# Patient Record
Sex: Female | Born: 1955 | Race: White | Hispanic: No | Marital: Married | Smoking: Never smoker
Health system: Southern US, Community
[De-identification: ages and names within clinical notes are randomized; demographics above are authoritative.]

## PROBLEM LIST (undated history)

## (undated) DIAGNOSIS — E785 Hyperlipidemia, unspecified: Secondary | ICD-10-CM

## (undated) DIAGNOSIS — K219 Gastro-esophageal reflux disease without esophagitis: Secondary | ICD-10-CM

## (undated) DIAGNOSIS — M1990 Unspecified osteoarthritis, unspecified site: Secondary | ICD-10-CM

## (undated) DIAGNOSIS — B019 Varicella without complication: Secondary | ICD-10-CM

## (undated) DIAGNOSIS — J301 Allergic rhinitis due to pollen: Secondary | ICD-10-CM

## (undated) DIAGNOSIS — G43909 Migraine, unspecified, not intractable, without status migrainosus: Secondary | ICD-10-CM

## (undated) DIAGNOSIS — I10 Essential (primary) hypertension: Secondary | ICD-10-CM

## (undated) HISTORY — DX: Unspecified osteoarthritis, unspecified site: M19.90

## (undated) HISTORY — DX: Migraine, unspecified, not intractable, without status migrainosus: G43.909

## (undated) HISTORY — DX: Hyperlipidemia, unspecified: E78.5

## (undated) HISTORY — DX: Essential (primary) hypertension: I10

## (undated) HISTORY — DX: Allergic rhinitis due to pollen: J30.1

## (undated) HISTORY — DX: Varicella without complication: B01.9

## (undated) HISTORY — DX: Gastro-esophageal reflux disease without esophagitis: K21.9

---
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
## 2004-11-03 LAB — HM COLONOSCOPY: HM COLON: NORMAL

## 2008-02-22 ENCOUNTER — Ambulatory Visit: Payer: Self-pay

## 2011-02-25 ENCOUNTER — Ambulatory Visit: Payer: Self-pay | Admitting: Unknown Physician Specialty

## 2012-03-15 LAB — BASIC METABOLIC PANEL: Glucose: 93 mg/dL

## 2013-02-01 LAB — HM MAMMOGRAPHY: HM Mammogram: NORMAL

## 2013-02-01 LAB — HM PAP SMEAR: HM PAP: NORMAL

## 2014-03-15 LAB — HEPATIC FUNCTION PANEL
ALT: 26 U/L (ref 7–35)
AST: 27 U/L (ref 13–35)
Bilirubin, Total: 0.4 mg/dL

## 2014-03-15 LAB — BASIC METABOLIC PANEL
BUN: 13 mg/dL (ref 4–21)
Potassium: 4.1 mmol/L (ref 3.4–5.3)
SODIUM: 141 mmol/L (ref 137–147)

## 2014-03-15 LAB — LIPID PANEL
CHOLESTEROL: 198 mg/dL (ref 0–200)
HDL: 53 mg/dL (ref 35–70)
Triglycerides: 143 mg/dL (ref 40–160)

## 2014-08-27 ENCOUNTER — Emergency Department: Payer: Self-pay | Admitting: Emergency Medicine

## 2015-01-23 ENCOUNTER — Ambulatory Visit: Payer: Self-pay | Admitting: Nurse Practitioner

## 2015-01-24 ENCOUNTER — Ambulatory Visit: Payer: Self-pay | Admitting: Nurse Practitioner

## 2015-02-12 ENCOUNTER — Ambulatory Visit (INDEPENDENT_AMBULATORY_CARE_PROVIDER_SITE_OTHER): Payer: Managed Care, Other (non HMO) | Admitting: Nurse Practitioner

## 2015-02-12 ENCOUNTER — Encounter: Payer: Self-pay | Admitting: Nurse Practitioner

## 2015-02-12 ENCOUNTER — Encounter (INDEPENDENT_AMBULATORY_CARE_PROVIDER_SITE_OTHER): Payer: Self-pay

## 2015-02-12 VITALS — BP 148/78 | HR 60 | Temp 98.8°F | Resp 14 | Ht 59.0 in | Wt 165.0 lb

## 2015-02-12 DIAGNOSIS — K219 Gastro-esophageal reflux disease without esophagitis: Secondary | ICD-10-CM

## 2015-02-12 DIAGNOSIS — Z13220 Encounter for screening for lipoid disorders: Secondary | ICD-10-CM | POA: Diagnosis not present

## 2015-02-12 DIAGNOSIS — R208 Other disturbances of skin sensation: Secondary | ICD-10-CM

## 2015-02-12 DIAGNOSIS — Z131 Encounter for screening for diabetes mellitus: Secondary | ICD-10-CM | POA: Diagnosis not present

## 2015-02-12 DIAGNOSIS — Z1329 Encounter for screening for other suspected endocrine disorder: Secondary | ICD-10-CM

## 2015-02-12 DIAGNOSIS — Z23 Encounter for immunization: Secondary | ICD-10-CM

## 2015-02-12 DIAGNOSIS — H04202 Unspecified epiphora, left lacrimal gland: Secondary | ICD-10-CM

## 2015-02-12 DIAGNOSIS — Z7189 Other specified counseling: Secondary | ICD-10-CM | POA: Diagnosis not present

## 2015-02-12 DIAGNOSIS — Z130 Encounter for screening for diseases of the blood and blood-forming organs and certain disorders involving the immune mechanism: Secondary | ICD-10-CM | POA: Diagnosis not present

## 2015-02-12 DIAGNOSIS — Z7689 Persons encountering health services in other specified circumstances: Secondary | ICD-10-CM

## 2015-02-12 DIAGNOSIS — R200 Anesthesia of skin: Secondary | ICD-10-CM

## 2015-02-12 DIAGNOSIS — I10 Essential (primary) hypertension: Secondary | ICD-10-CM | POA: Diagnosis not present

## 2015-02-12 DIAGNOSIS — M1990 Unspecified osteoarthritis, unspecified site: Secondary | ICD-10-CM

## 2015-02-12 LAB — HEMOGLOBIN A1C: Hgb A1c MFr Bld: 5.9 % (ref 4.6–6.5)

## 2015-02-12 LAB — CBC WITH DIFFERENTIAL/PLATELET
Basophils Absolute: 0 10*3/uL (ref 0.0–0.1)
Basophils Relative: 0.6 % (ref 0.0–3.0)
EOS PCT: 0.9 % (ref 0.0–5.0)
Eosinophils Absolute: 0.1 10*3/uL (ref 0.0–0.7)
HCT: 37.9 % (ref 36.0–46.0)
Hemoglobin: 13 g/dL (ref 12.0–15.0)
LYMPHS PCT: 34.9 % (ref 12.0–46.0)
Lymphs Abs: 2.1 10*3/uL (ref 0.7–4.0)
MCHC: 34.3 g/dL (ref 30.0–36.0)
MCV: 83.2 fl (ref 78.0–100.0)
Monocytes Absolute: 0.4 10*3/uL (ref 0.1–1.0)
Monocytes Relative: 6.8 % (ref 3.0–12.0)
Neutro Abs: 3.4 10*3/uL (ref 1.4–7.7)
Neutrophils Relative %: 56.8 % (ref 43.0–77.0)
Platelets: 224 10*3/uL (ref 150.0–400.0)
RBC: 4.56 Mil/uL (ref 3.87–5.11)
RDW: 13.1 % (ref 11.5–15.5)
WBC: 6 10*3/uL (ref 4.0–10.5)

## 2015-02-12 LAB — COMPREHENSIVE METABOLIC PANEL
ALK PHOS: 101 U/L (ref 39–117)
ALT: 27 U/L (ref 0–35)
AST: 26 U/L (ref 0–37)
Albumin: 4.1 g/dL (ref 3.5–5.2)
BILIRUBIN TOTAL: 0.5 mg/dL (ref 0.2–1.2)
BUN: 16 mg/dL (ref 6–23)
CO2: 30 meq/L (ref 19–32)
Calcium: 9.4 mg/dL (ref 8.4–10.5)
Chloride: 102 mEq/L (ref 96–112)
Creatinine, Ser: 0.69 mg/dL (ref 0.40–1.20)
GFR: 93.18 mL/min (ref >60.00)
Glucose, Bld: 89 mg/dL (ref 70–99)
Potassium: 3.9 mEq/L (ref 3.5–5.1)
SODIUM: 137 meq/L (ref 135–145)
TOTAL PROTEIN: 7.6 g/dL (ref 6.0–8.3)

## 2015-02-12 LAB — LIPID PANEL
CHOL/HDL RATIO: 4
Cholesterol: 206 mg/dL — ABNORMAL HIGH (ref 0–200)
HDL: 58.5 mg/dL (ref >39.00)
LDL CALC: 121 mg/dL — AB (ref 0–99)
NonHDL: 147.5
Triglycerides: 134 mg/dL (ref 0.0–149.0)
VLDL: 26.8 mg/dL (ref 0.0–40.0)

## 2015-02-12 LAB — TSH: TSH: 0.45 u[IU]/mL (ref 0.35–4.50)

## 2015-02-12 LAB — VITAMIN B12: VITAMIN B 12: 420 pg/mL (ref 211–911)

## 2015-02-12 NOTE — Progress Notes (Signed)
Pre visit review using our clinic review tool, if applicable. No additional management support is needed unless otherwise documented below in the visit note. 

## 2015-02-12 NOTE — Patient Instructions (Addendum)
Nice to meet you today.   Thank you for getting your tdap- move arm often and take naprosyn as needed for tenderness.   Please visit the lab before leaving.  Follow up in 3 months.   Welcome to [facility]!

## 2015-02-12 NOTE — Assessment & Plan Note (Signed)
Discussed acute and chronic issues. Reviewed health maintenance measures, PFSHx, and immunizations. Obtain routine labs TSH, Lipid panel, CBC w/ diff, A1c, and CMET.

## 2015-02-12 NOTE — Progress Notes (Signed)
Subjective:    Patient ID: Roberta Gill, female    DOB: [birth year] Gill, 59 y.o.   MRN: [id number]  HPI  [name] is a 59 yo female establishing care and a CC of left eye watering x 3 months.   1) New Pt info-  Immunizations- Needs tdap, brand new grand baby   Mammogram- 2014, normal   Pap- 2014 normal   Colonoscopy- 10 years ago wanted a baseline  Eye Exam- 3 weeks ago   Dental Exam- Not UTD, been Roberta Gill years   Roberta Gill) Chronic Problems-  Arthritis- hands bilaterally, not as painful now, ergonomic keyboard and mouse, beaded mat under. Naprosyn helpful when needed.   GERD- intermittent, fried foods trigger, takes [facility] brand acid reliever.   Hyperlipidemia- up in the past, cutting down fatty fried foods   Hypertension- Was on medication in the past, 5 mg lisinopril- stopped in Jan. 2016 because hair was falling out, HCTZ- hair falling out was worse.    Left knee surgery in high school- ligament injury from a fall. Arthroscopic surgery   3) Acute Problems-  Left eye tearing- eye exam 3 weeks ago, also feel tired   Hair thinning- tested thyroid in past, no recent checks   Other Providers-  Ortho hand doctor- 6 years ago, has braces for hands   Review of Systems  Constitutional: Positive for fatigue. Negative for fever, chills and diaphoresis.  HENT: Negative for tinnitus and trouble swallowing.   Eyes: Positive for discharge and visual disturbance.       Left eye watering and blurry- seen by optometrist  Respiratory: Negative for chest tightness, shortness of breath and wheezing.   Cardiovascular: Negative for chest pain, palpitations and leg swelling.  Gastrointestinal: Negative for nausea, vomiting, diarrhea and constipation.  Genitourinary: Negative for difficulty urinating.  Musculoskeletal: Negative for back pain and neck pain.  Skin: Negative for rash.  Neurological: Positive for numbness. Negative for dizziness, weakness and headaches.       Numbness in hands when driving-  both hands all over no particular finger. Hx of carpal tunnel  Hematological: Does not bruise/bleed easily.  Psychiatric/Behavioral: Negative for suicidal ideas and sleep disturbance. The patient is not nervous/anxious.    Past Medical History  Diagnosis Date  . Arthritis   . GERD (gastroesophageal reflux disease)   . Chicken pox   . Hypertension   . Hyperlipidemia   . Hayfever   . Migraine     History   Social History  . Marital Status: Married    Spouse Name: Roberta Gill  . Number of Children: Roberta Gill  . Years of Education: Roberta Gill   Occupational History  . Not on file.   Social History Main Topics  . Smoking status: Never Smoker   . Smokeless tobacco: Never Used  . Alcohol Use: No  . Drug Use: No  . Sexual Activity:    Partners: Male     Comment: Husband   Other Topics Concern  . Not on file   Social History Narrative   [employer] administrator    Lives with husband    Roberta Gill children Roberta Gill, Roberta Gill yo    Roberta Gill grandchildren- Roberta Gill yo and Roberta Gill week yo   No pets in house   Right handed    Caffeine- 1 cup of tea in am, 1 cup tea if lunch out        Water the rest of the day    Enjoys reading and hanging with the family  Past Surgical History  Procedure Laterality Date  . Cesarean section    . Tonsillectomy    . Knee surgery Left 1976    Family History  Problem Relation Age of Onset  . Arthritis Mother   . Cancer Mother     breast  . Heart disease Mother   . Hypertension Mother   . Kidney disease Mother   . Diabetes Mother   . Heart disease Father   . Hypertension Paternal Grandfather     Not on File  No current outpatient prescriptions on file prior to visit.   No current facility-administered medications on file prior to visit.      Objective:   Physical Exam  Constitutional: She is oriented to person, place, and time. She appears well-developed and well-nourished. No distress.  BP 148/78 mmHg  Pulse 60  Temp(Src) 98.8 F (37.1 C) (Oral)  Resp 14  Ht 4'  11" (1.499 m)  Wt 165 lb (74.844 kg)  BMI 33.31 kg/m2  SpO2 98%  LMP 02/12/2008   HENT:  Head: Normocephalic and atraumatic.  Right Ear: External ear normal.  Left Ear: External ear normal.  Eyes: Conjunctivae and EOM are normal. Pupils are equal, round, and reactive to light. Right eye exhibits no discharge. Left eye exhibits discharge. No scleral icterus.  Watery discharge- no visible findings, probable blocked duct  Neck: Normal range of motion. Neck supple. No thyromegaly present.  Cardiovascular: Normal rate, regular rhythm, normal heart sounds and intact distal pulses.  Exam reveals no gallop and no friction rub.   No murmur heard. Pulmonary/Chest: Effort normal and breath sounds normal. No respiratory distress. She has no wheezes. She has no rales. She exhibits no tenderness.  Musculoskeletal: Normal range of motion. She exhibits no edema or tenderness.  Lymphadenopathy:    She has no cervical adenopathy.  Neurological: She is alert and oriented to person, place, and time. No cranial nerve deficit. She exhibits normal muscle tone. Coordination normal.  Skin: Skin is warm and dry. No rash noted. She is not diaphoretic.  Psychiatric: She has a normal mood and affect. Her behavior is normal. Judgment and thought content normal.      Assessment & Plan:

## 2015-02-18 DIAGNOSIS — H04202 Unspecified epiphora, left lacrimal gland: Secondary | ICD-10-CM | POA: Insufficient documentation

## 2015-02-18 DIAGNOSIS — M1990 Unspecified osteoarthritis, unspecified site: Secondary | ICD-10-CM | POA: Insufficient documentation

## 2015-02-18 DIAGNOSIS — I10 Essential (primary) hypertension: Secondary | ICD-10-CM | POA: Insufficient documentation

## 2015-02-18 DIAGNOSIS — R200 Anesthesia of skin: Secondary | ICD-10-CM | POA: Insufficient documentation

## 2015-02-18 DIAGNOSIS — K219 Gastro-esophageal reflux disease without esophagitis: Secondary | ICD-10-CM | POA: Insufficient documentation

## 2015-02-18 NOTE — Assessment & Plan Note (Signed)
Stable with Naprosyn.

## 2015-02-18 NOTE — Assessment & Plan Note (Signed)
Stable with cutting down on trigger foods and [facility] brand acid reliever.

## 2015-02-18 NOTE — Assessment & Plan Note (Signed)
Will obtain B12 level.

## 2015-02-18 NOTE — Assessment & Plan Note (Signed)
BP Readings from Last 3 Encounters:  02/12/15 148/78   Pt reports hair falling out. Wants to wait before starting BP medication.

## 2015-05-15 ENCOUNTER — Ambulatory Visit (INDEPENDENT_AMBULATORY_CARE_PROVIDER_SITE_OTHER): Payer: Managed Care, Other (non HMO) | Admitting: Nurse Practitioner

## 2015-05-15 ENCOUNTER — Encounter: Payer: Self-pay | Admitting: Nurse Practitioner

## 2015-05-15 VITALS — BP 198/98 | HR 52 | Temp 98.3°F | Resp 16 | Ht 59.0 in | Wt 162.2 lb

## 2015-05-15 DIAGNOSIS — R200 Anesthesia of skin: Secondary | ICD-10-CM

## 2015-05-15 DIAGNOSIS — H04202 Unspecified epiphora, left lacrimal gland: Secondary | ICD-10-CM | POA: Diagnosis not present

## 2015-05-15 DIAGNOSIS — R208 Other disturbances of skin sensation: Secondary | ICD-10-CM | POA: Diagnosis not present

## 2015-05-15 DIAGNOSIS — I10 Essential (primary) hypertension: Secondary | ICD-10-CM | POA: Diagnosis not present

## 2015-05-15 MED ORDER — AMLODIPINE BESYLATE 5 MG PO TABS: 5.0000 mg | ORAL_TABLET | Freq: Every day | ORAL | Status: DC

## 2015-05-15 NOTE — Assessment & Plan Note (Signed)
Resolved. No further complaints. She reports she has had nothing done.

## 2015-05-15 NOTE — Assessment & Plan Note (Signed)
Sees ortho- changed her sleeping position and it is improving. Will follow.

## 2015-05-15 NOTE — Progress Notes (Signed)
Pre visit review using our clinic review tool, if applicable. No additional management support is needed unless otherwise documented below in the visit note. 

## 2015-05-15 NOTE — Progress Notes (Signed)
   Subjective:    Patient ID: Roberta Gill, female    DOB: 12-07-1955, 59 y.o.   MRN: [id number]  HPI  Roberta Gill is a 59 yo female here for a 3 month follow up.   1) Watery eyes- took care of itself, stopped after 2 more weeks.   2) Carpal Tunnel- still experiencing numbness when driving, some improvement  3) HTN- can't do lisinopril and HCTZ- makes her hair fall out she reports.   Review of Systems  Constitutional: Negative for fever, chills, diaphoresis and fatigue.  Respiratory: Negative for chest tightness, shortness of breath and wheezing.   Cardiovascular: Negative for chest pain, palpitations and leg swelling.  Gastrointestinal: Negative for nausea, vomiting and diarrhea.  Skin: Negative for rash.  Neurological: Positive for numbness. Negative for dizziness, weakness and headaches.       Pt has carpal tunnel- improving and seeing ortho  Psychiatric/Behavioral: The patient is not nervous/anxious.       Objective:   Physical Exam  Constitutional: She is oriented to person, place, and time. She appears well-developed and well-nourished. No distress.  BP 198/98 mmHg  Pulse 52  Temp(Src) 98.3 F (36.8 C)  Resp 16  Ht 4\' 11"  (1.499 m)  Wt 162 lb 3.2 oz (73.573 kg)  BMI 32.74 kg/m2  SpO2 98%  LMP 02/12/2008   HENT:  Head: Normocephalic and atraumatic.  Right Ear: External ear normal.  Left Ear: External ear normal.  Cardiovascular: Normal rate, regular rhythm, normal heart sounds and intact distal pulses.  Exam reveals no gallop and no friction rub.   No murmur heard. Pulmonary/Chest: Effort normal and breath sounds normal. No respiratory distress. She has no wheezes. She has no rales. She exhibits no tenderness.  Musculoskeletal: She exhibits edema.  Ankles slightly non-pitting edema  Neurological: She is alert and oriented to person, place, and time. No cranial nerve deficit. She exhibits normal muscle tone. Coordination normal.  Skin: Skin is warm and dry. No  rash noted. She is not diaphoretic.  Psychiatric: She has a normal mood and affect. Her behavior is normal. Judgment and thought content normal.      Assessment & Plan:

## 2015-05-15 NOTE — Patient Instructions (Signed)
Follow up in 2 months for BP.   Call us next week with a reading and pulse please.

## 2015-05-15 NOTE — Assessment & Plan Note (Signed)
Pt reports Lisinopril and HCTZ makes her hair fall out- notices after a few weeks to 1 month after taking the medication. Will try amlodipine 5 mg daily. Asked her to call us with a BP and pulse in 1 week (stops by [facility] on her way home to check). Will follow up in 2 months- need lab work.

## 2015-07-15 ENCOUNTER — Other Ambulatory Visit: Payer: Self-pay | Admitting: Nurse Practitioner

## 2015-07-17 ENCOUNTER — Ambulatory Visit: Payer: Managed Care, Other (non HMO) | Admitting: Nurse Practitioner

## 2015-07-20 ENCOUNTER — Ambulatory Visit (INDEPENDENT_AMBULATORY_CARE_PROVIDER_SITE_OTHER): Payer: Managed Care, Other (non HMO) | Admitting: Nurse Practitioner

## 2015-07-20 ENCOUNTER — Encounter: Payer: Self-pay | Admitting: Nurse Practitioner

## 2015-07-20 VITALS — BP 124/66 | HR 54 | Temp 98.1°F | Resp 16 | Ht 59.0 in | Wt 163.8 lb

## 2015-07-20 DIAGNOSIS — R208 Other disturbances of skin sensation: Secondary | ICD-10-CM | POA: Diagnosis not present

## 2015-07-20 DIAGNOSIS — I10 Essential (primary) hypertension: Secondary | ICD-10-CM

## 2015-07-20 DIAGNOSIS — H04202 Unspecified epiphora, left lacrimal gland: Secondary | ICD-10-CM

## 2015-07-20 DIAGNOSIS — R200 Anesthesia of skin: Secondary | ICD-10-CM

## 2015-07-20 LAB — BASIC METABOLIC PANEL
BUN: 16 mg/dL (ref 6–23)
CHLORIDE: 102 meq/L (ref 96–112)
CO2: 29 meq/L (ref 19–32)
Calcium: 9.4 mg/dL (ref 8.4–10.5)
Creatinine, Ser: 0.74 mg/dL (ref 0.40–1.20)
GFR: 85.82 mL/min (ref >60.00)
Glucose, Bld: 79 mg/dL (ref 70–99)
POTASSIUM: 3.8 meq/L (ref 3.5–5.1)
SODIUM: 139 meq/L (ref 135–145)

## 2015-07-20 NOTE — Patient Instructions (Signed)
Awesome blood pressure! Follow up in 6 months.   Please stop by lab before leaving today.

## 2015-07-20 NOTE — Progress Notes (Signed)
Pre visit review using our clinic review tool, if applicable. No additional management support is needed unless otherwise documented below in the visit note. 

## 2015-07-20 NOTE — Assessment & Plan Note (Signed)
BP Readings from Last 3 Encounters:  07/20/15 124/66  05/15/15 198/98  02/12/15 148/78   Lab Results  Component Value Date   CREATININE 0.69 02/12/2015   Will obtain BMET today. FU in 6 months. Continue Amlodipine 5 mg

## 2015-07-20 NOTE — Assessment & Plan Note (Addendum)
Stable, no further complaints.

## 2015-07-20 NOTE — Progress Notes (Signed)
Patient ID: Roberta Gill, female    DOB: Apr 03, 1956  Age: 59 y.o. MRN: [id number]  CC: Follow-up   HPI [name] presents for follow up of numbness in hands, watery left eye, and HTN.   1) Watery left eye- Resolved    2) Carpal Tunnel- Same, no further complaints  3) HTN- Started amlodipine 5 mg at last visit and has had no further complaints. Pt reports no change in diet and exercise from last visit.   History Roberta Gill has a past medical history of Arthritis; GERD (gastroesophageal reflux disease); Chicken pox; Hypertension; Hyperlipidemia; Hayfever; and Migraine.   She has past surgical history that includes Cesarean section; Tonsillectomy; and Knee surgery (Left, 1976).   Her family history includes Arthritis in her mother; Cancer in her mother; Diabetes in her mother; Heart disease in her father and mother; Hypertension in her mother and paternal grandfather; Kidney disease in her mother.She reports that she has never smoked. She has never used smokeless tobacco. She reports that she does not drink alcohol or use illicit drugs.  Outpatient Prescriptions Prior to Visit  Medication Sig Dispense Refill  . amLODipine (NORVASC) 5 MG tablet TAKE 1 TABLET (5 MG TOTAL) BY MOUTH DAILY. 90 tablet 0  . cholecalciferol (VITAMIN D) 1000 UNITS tablet Take 2,000 Units by mouth daily.    . Multiple Vitamin (MULTIVITAMIN) tablet Take 1 tablet by mouth daily.     No facility-administered medications prior to visit.    ROS Review of Systems  Constitutional: Negative for fever, chills, diaphoresis and fatigue.  Eyes: Negative for visual disturbance.  Respiratory: Negative for cough.   Cardiovascular: Negative for chest pain, palpitations and leg swelling.  Gastrointestinal: Negative for nausea, vomiting and diarrhea.  Skin: Negative for rash.    Objective:  BP 124/66 mmHg  Pulse 54  Temp(Src) 98.1 F (36.7 C)  Resp 16  Ht 4\' 11"  (1.499 m)  Wt 163 lb 12.8 oz (74.299 kg)  BMI  33.07 kg/m2  SpO2 98%  LMP 02/12/2008  Physical Exam  Constitutional: She is oriented to person, place, and time. She appears well-developed and well-nourished. No distress.  HENT:  Head: Normocephalic and atraumatic.  Right Ear: External ear normal.  Left Ear: External ear normal.  Eyes: Right eye exhibits no discharge. Left eye exhibits no discharge. No scleral icterus.  Cardiovascular: Normal rate and regular rhythm.   Pulmonary/Chest: Effort normal and breath sounds normal. No respiratory distress. She has no wheezes. She has no rales. She exhibits no tenderness.  Musculoskeletal: She exhibits no edema.  No LE edema  Neurological: She is alert and oriented to person, place, and time. No cranial nerve deficit. She exhibits normal muscle tone. Coordination normal.  Skin: Skin is warm and dry. No rash noted. She is not diaphoretic.  Psychiatric: She has a normal mood and affect. Her behavior is normal. Judgment and thought content normal.   Assessment & Plan:   Merit was seen today for follow-up.  Diagnoses and all orders for this visit:  Benign essential HTN -     Basic Metabolic Panel (BMET)  I am having Ms. Landgren maintain her multivitamin, cholecalciferol, amLODipine, and Loratadine.  Meds ordered this encounter  Medications  . Loratadine (CLARITIN) 10 MG CAPS    Sig: Take by mouth as needed.     Follow-up: Return in about 6 months (around 01/17/2016) for HTN.

## 2015-07-20 NOTE — Assessment & Plan Note (Signed)
Resolved

## 2015-08-18 ENCOUNTER — Other Ambulatory Visit: Payer: Self-pay | Admitting: Nurse Practitioner

## 2015-10-05 ENCOUNTER — Telehealth: Payer: Self-pay | Admitting: *Deleted

## 2015-10-05 NOTE — Telephone Encounter (Signed)
Patient has concern's if her lab results were faxed to [facility]. Patient stated that she will fax over results herself also.

## 2015-10-08 NOTE — Telephone Encounter (Signed)
Roberta Gill please advise?

## 2016-04-18 ENCOUNTER — Other Ambulatory Visit: Payer: Self-pay | Admitting: Obstetrics & Gynecology

## 2016-04-18 DIAGNOSIS — Z1239 Encounter for other screening for malignant neoplasm of breast: Secondary | ICD-10-CM

## 2016-05-14 ENCOUNTER — Ambulatory Visit
Admission: RE | Admit: 2016-05-14 | Discharge: 2016-05-14 | Disposition: A | Discharge status: Home / Self Care | Payer: Managed Care, Other (non HMO) | Source: Ambulatory Visit | Attending: Obstetrics & Gynecology | Admitting: Obstetrics & Gynecology

## 2016-05-14 ENCOUNTER — Other Ambulatory Visit: Payer: Self-pay | Admitting: Obstetrics & Gynecology

## 2016-05-14 DIAGNOSIS — R921 Mammographic calcification found on diagnostic imaging of breast: Secondary | ICD-10-CM | POA: Insufficient documentation

## 2016-05-14 DIAGNOSIS — Z1231 Encounter for screening mammogram for malignant neoplasm of breast: Secondary | ICD-10-CM | POA: Diagnosis present

## 2016-05-14 DIAGNOSIS — Z1239 Encounter for other screening for malignant neoplasm of breast: Secondary | ICD-10-CM

## 2016-05-20 ENCOUNTER — Other Ambulatory Visit: Payer: Self-pay | Admitting: Obstetrics & Gynecology

## 2016-05-20 DIAGNOSIS — R921 Mammographic calcification found on diagnostic imaging of breast: Secondary | ICD-10-CM

## 2016-06-12 ENCOUNTER — Ambulatory Visit
Admission: RE | Admit: 2016-06-12 | Discharge: 2016-06-12 | Disposition: A | Discharge status: Home / Self Care | Payer: Managed Care, Other (non HMO) | Source: Ambulatory Visit | Attending: Obstetrics & Gynecology | Admitting: Obstetrics & Gynecology

## 2016-06-12 DIAGNOSIS — R921 Mammographic calcification found on diagnostic imaging of breast: Secondary | ICD-10-CM | POA: Insufficient documentation

## 2016-06-20 ENCOUNTER — Other Ambulatory Visit: Payer: Self-pay | Admitting: Obstetrics & Gynecology

## 2016-06-20 DIAGNOSIS — R921 Mammographic calcification found on diagnostic imaging of breast: Secondary | ICD-10-CM

## 2016-06-25 ENCOUNTER — Ambulatory Visit
Admission: RE | Admit: 2016-06-25 | Discharge: 2016-06-25 | Disposition: A | Discharge status: Home / Self Care | Payer: Managed Care, Other (non HMO) | Source: Ambulatory Visit | Attending: Obstetrics & Gynecology | Admitting: Obstetrics & Gynecology

## 2016-06-25 DIAGNOSIS — R921 Mammographic calcification found on diagnostic imaging of breast: Secondary | ICD-10-CM | POA: Insufficient documentation

## 2016-06-25 DIAGNOSIS — D241 Benign neoplasm of right breast: Secondary | ICD-10-CM | POA: Insufficient documentation

## 2016-06-25 DIAGNOSIS — N6021 Fibroadenosis of right breast: Secondary | ICD-10-CM | POA: Insufficient documentation

## 2016-06-25 HISTORY — PX: BREAST BIOPSY: SHX20

## 2016-06-26 LAB — SURGICAL PATHOLOGY

## 2016-08-14 ENCOUNTER — Encounter: Payer: Self-pay | Admitting: *Deleted

## 2016-08-18 ENCOUNTER — Ambulatory Visit: Payer: Managed Care, Other (non HMO) | Admitting: Anesthesiology

## 2016-08-18 ENCOUNTER — Ambulatory Visit
Admission: RE | Admit: 2016-08-18 | Discharge: 2016-08-18 | Disposition: A | Discharge status: Home / Self Care | Payer: Managed Care, Other (non HMO) | Source: Ambulatory Visit | Attending: Unknown Physician Specialty | Admitting: Unknown Physician Specialty

## 2016-08-18 ENCOUNTER — Encounter
Admission: RE | Disposition: A | Discharge status: Home / Self Care | Payer: Self-pay | Source: Ambulatory Visit | Attending: Unknown Physician Specialty

## 2016-08-18 ENCOUNTER — Encounter: Payer: Self-pay | Admitting: *Deleted

## 2016-08-18 DIAGNOSIS — I10 Essential (primary) hypertension: Secondary | ICD-10-CM | POA: Insufficient documentation

## 2016-08-18 DIAGNOSIS — Z1211 Encounter for screening for malignant neoplasm of colon: Secondary | ICD-10-CM | POA: Insufficient documentation

## 2016-08-18 DIAGNOSIS — G43909 Migraine, unspecified, not intractable, without status migrainosus: Secondary | ICD-10-CM | POA: Insufficient documentation

## 2016-08-18 DIAGNOSIS — Z79899 Other long term (current) drug therapy: Secondary | ICD-10-CM | POA: Diagnosis not present

## 2016-08-18 DIAGNOSIS — K640 First degree hemorrhoids: Secondary | ICD-10-CM | POA: Insufficient documentation

## 2016-08-18 DIAGNOSIS — E785 Hyperlipidemia, unspecified: Secondary | ICD-10-CM | POA: Diagnosis not present

## 2016-08-18 DIAGNOSIS — K5730 Diverticulosis of large intestine without perforation or abscess without bleeding: Secondary | ICD-10-CM | POA: Diagnosis not present

## 2016-08-18 DIAGNOSIS — M1990 Unspecified osteoarthritis, unspecified site: Secondary | ICD-10-CM | POA: Diagnosis not present

## 2016-08-18 DIAGNOSIS — K219 Gastro-esophageal reflux disease without esophagitis: Secondary | ICD-10-CM | POA: Diagnosis not present

## 2016-08-18 HISTORY — PX: COLONOSCOPY WITH PROPOFOL: SHX5780

## 2016-08-18 SURGERY — COLONOSCOPY WITH PROPOFOL
Anesthesia: General

## 2016-08-18 MED ORDER — PROPOFOL 10 MG/ML IV BOLUS
INTRAVENOUS | Status: DC | PRN
  Administered 2016-08-18: 90 mg via INTRAVENOUS

## 2016-08-18 MED ORDER — SODIUM CHLORIDE 0.9 % IV SOLN
INTRAVENOUS | Status: DC
  Administered 2016-08-18: 14:00:00 via INTRAVENOUS

## 2016-08-18 MED ORDER — PROPOFOL 500 MG/50ML IV EMUL
INTRAVENOUS | Status: DC | PRN
  Administered 2016-08-18: 140 ug/kg/min via INTRAVENOUS

## 2016-08-18 MED ORDER — SODIUM CHLORIDE 0.9 % IV SOLN: INTRAVENOUS | Status: DC

## 2016-08-18 MED ORDER — MIDAZOLAM HCL 2 MG/2ML IJ SOLN
INTRAMUSCULAR | Status: DC | PRN
  Administered 2016-08-18: 1 mg via INTRAVENOUS

## 2016-08-18 NOTE — H&P (Signed)
Primary Care Physician:  Hoyt Koch, MD Primary Gastroenterologist:  Dr. Vira Agar  Pre-Procedure History & Physical: HPI:  Roberta Gill is a 60 y.o. female is here for an colonoscopy.   Past Medical History:  Diagnosis Date  . Arthritis   . Chicken pox   . GERD (gastroesophageal reflux disease)   . Hayfever   . Hyperlipidemia   . Hypertension   . Migraine     Past Surgical History:  Procedure Laterality Date  . BREAST BIOPSY Right 06/25/2016   path pending  . CESAREAN SECTION    . KNEE SURGERY Left 1976    Prior to Admission medications   Medication Sig Start Date End Date Taking? Authorizing Provider  amLODipine (NORVASC) 5 MG tablet TAKE 1 TABLET (5 MG TOTAL) BY MOUTH DAILY. 08/18/15  Yes Rubbie Battiest, NP  cholecalciferol (VITAMIN D) 1000 UNITS tablet Take 2,000 Units by mouth daily.   Yes Historical Provider, MD  Multiple Vitamin (MULTIVITAMIN) tablet Take 1 tablet by mouth daily.   Yes Historical Provider, MD  Loratadine (CLARITIN) 10 MG CAPS Take by mouth as needed.    Historical Provider, MD    Allergies as of 06/26/2016 - Review Complete 07/20/2015  Allergen Reaction Noted  . Amoxicillin-pot clavulanate  02/12/2015    Family History  Problem Relation Age of Onset  . Arthritis Mother   . Cancer Mother     breast  . Heart disease Mother   . Hypertension Mother   . Kidney disease Mother   . Diabetes Mother   . Breast cancer Mother   . Hypertension Paternal Grandfather   . Heart disease Father     Social History   Social History  . Marital status: Married    Spouse name: N/A  . Number of children: N/A  . Years of education: N/A   Occupational History  . Not on file.   Social History Main Topics  . Smoking status: Never Smoker  . Smokeless tobacco: Never Used  . Alcohol use No  . Drug use: No  . Sexual activity: Yes    Partners: Male     Comment: Husband   Other Topics Concern  . 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 Neck:  Supple; no masses or thyromegaly. Lungs:  Clear throughout to auscultation.    Heart:  Regular rate and rhythm. Abdomen:  Soft, nontender and nondistended. Normal bowel sounds, without guarding, and without rebound.   Neurologic:  Alert and  oriented x4;  grossly normal neurologically.  Impression/Plan: KIMI MOCHIZUKI is here for an colonoscopy  to be performed for screening colonoscopy  Risks, benefits, limitations, and alternatives regarding  colonoscopy have been reviewed with the patient.  Questions have been answered.  All parties agreeable.   Gaylyn Cheers, MD  08/18/2016, 2:15 PM

## 2016-08-18 NOTE — Anesthesia Preprocedure Evaluation (Signed)
Anesthesia Evaluation  Patient identified by MRN, date of birth, ID band Patient awake    Reviewed: Allergy & Precautions, H&P , NPO status , Patient's Chart, lab work & pertinent test results  History of Anesthesia Complications Negative for: history of anesthetic complications  Airway Mallampati: III  TM Distance: >3 FB Neck ROM: full    Dental  (+) Poor Dentition   Pulmonary neg pulmonary ROS, neg shortness of breath,    Pulmonary exam normal breath sounds clear to auscultation       Cardiovascular Exercise Tolerance: Good hypertension, (-) angina(-) DOE Normal cardiovascular exam Rhythm:regular Rate:Normal     Neuro/Psych  Headaches, negative psych ROS   GI/Hepatic Neg liver ROS, GERD  Controlled,  Endo/Other  negative endocrine ROS  Renal/GU negative Renal ROS  negative genitourinary   Musculoskeletal  (+) Arthritis ,   Abdominal   Peds  Hematology negative hematology ROS (+)   Anesthesia Other Findings Past Medical History: No date: Arthritis No date: Chicken pox No date: GERD (gastroesophageal reflux disease) No date: Hayfever No date: Hyperlipidemia No date: Hypertension No date: Migraine  Past Surgical History: 06/25/2016: BREAST BIOPSY Right     Comment: path pending No date: CESAREAN SECTION 1976: KNEE SURGERY Left  BMI    Body Mass Index:  32.42 kg/m      Reproductive/Obstetrics negative OB ROS                             Anesthesia Physical Anesthesia Plan  ASA: III  Anesthesia Plan: General   Post-op Pain Management:    Induction:   Airway Management Planned:   Additional Equipment:   Intra-op Plan:   Post-operative Plan:   Informed Consent: I have reviewed the patients History and Physical, chart, labs and discussed the procedure including the risks, benefits and alternatives for the proposed anesthesia with the patient or authorized  representative who has indicated his/her understanding and acceptance.   Dental Advisory Given  Plan Discussed with: Anesthesiologist, CRNA and Surgeon  Anesthesia Plan Comments:         Anesthesia Quick Evaluation

## 2016-08-18 NOTE — Transfer of Care (Signed)
Immediate Anesthesia Transfer of Care Note  Patient: Roberta Gill  Procedure(s) Performed: Procedure(s): COLONOSCOPY WITH PROPOFOL (N/A)  Patient Location: PACU and Endoscopy Unit  Anesthesia Type:General  Level of Consciousness: patient cooperative and lethargic  Airway & Oxygen Therapy: Patient Spontanous Breathing and Patient connected to nasal cannula oxygen  Post-op Assessment: Report given to RN and Post -op Vital signs reviewed and stable  Post vital signs: Reviewed and stable  Last Vitals:  Vitals:   08/18/16 1321 08/18/16 1445  BP: 138/75 (!) 88/44  Pulse: 68 (!) 54  Resp: 20 14  Temp: 36.6 C 36.4 C    Last Pain:  Vitals:   08/18/16 1445  TempSrc: Tympanic  PainSc: Asleep         Complications: No apparent anesthesia complications

## 2016-08-18 NOTE — Op Note (Signed)
[facility] Gastroenterology Patient Name: Roberta Gill Procedure Date: 08/18/2016 2:19 PM MRN: [id number] Account #: [id number] Date of Birth: [birth year] Admit Type: Outpatient Age: 60 Room: [facility] ENDO ROOM 4 Gender: Female Note Status: Finalized Procedure:            Colonoscopy Indications:          Screening for colorectal malignant neoplasm Providers:            Manya Silvas, MD Referring MD:         Barnett Applebaum, MD (Referring MD) Medicines:            Propofol per Anesthesia Complications:        No immediate complications. Procedure:            Pre-Anesthesia Assessment:                       - After reviewing the risks and benefits, the patient                        was deemed in satisfactory condition to undergo the                        procedure.                       After obtaining informed consent, the colonoscope was                        passed under direct vision. Throughout the procedure,                        the patient's blood pressure, pulse, and oxygen                        saturations were monitored continuously. The                        Colonoscope was introduced through the anus and                        advanced to the the cecum, identified by appendiceal                        orifice and ileocecal valve. The colonoscopy was                        performed without difficulty. The patient tolerated the                        procedure well. The quality of the bowel preparation                        was excellent. Findings:      A few small-mouthed diverticula were found in the sigmoid colon.      Internal hemorrhoids were found during endoscopy. The hemorrhoids were       small and Grade I (internal hemorrhoids that do not prolapse).      The exam was otherwise without abnormality. Impression:           - Diverticulosis in the sigmoid colon.                       -  Internal hemorrhoids.                       - The  examination was otherwise normal.                       - No specimens collected. Recommendation:       - Repeat colonoscopy in 10 years for screening                        purposes. If a close relative has had colon cancer or                        colon polyps then repeat in 5 years. Manya Silvas, MD 08/18/2016 2:42:14 PM This report has been signed electronically. Number of Addenda: 0 Note Initiated On: 08/18/2016 2:19 PM Scope Withdrawal Time: 0 hours 9 minutes 9 seconds  Total Procedure Duration: 0 hours 13 minutes 13 seconds       [facility]

## 2016-08-19 NOTE — Anesthesia Postprocedure Evaluation (Signed)
Anesthesia Post Note  Patient: Roberta Gill  Procedure(s) Performed: Procedure(s) (LRB): COLONOSCOPY WITH PROPOFOL (N/A)  Patient location during evaluation: Endoscopy Anesthesia Type: General Level of consciousness: awake and alert Pain management: pain level controlled Vital Signs Assessment: post-procedure vital signs reviewed and stable Respiratory status: spontaneous breathing, nonlabored ventilation and respiratory function stable Cardiovascular status: blood pressure returned to baseline and stable Postop Assessment: no signs of nausea or vomiting Anesthetic complications: no    Last Vitals:  Vitals:   08/18/16 1455 08/18/16 1505  BP: (!) 121/53 136/69  Pulse: (!) 55 60  Resp: 18 17  Temp:      Last Pain:  Vitals:   08/18/16 1445  TempSrc: Tympanic  PainSc: Asleep                 Martha Clan

## 2016-08-23 ENCOUNTER — Encounter: Payer: Self-pay | Admitting: Unknown Physician Specialty

## 2016-09-10 ENCOUNTER — Other Ambulatory Visit: Payer: Self-pay | Admitting: Nurse Practitioner

## 2016-12-30 ENCOUNTER — Other Ambulatory Visit: Payer: Self-pay | Admitting: Pediatrics

## 2017-11-12 ENCOUNTER — Other Ambulatory Visit: Payer: Self-pay | Admitting: Obstetrics & Gynecology

## 2017-11-12 DIAGNOSIS — Z87898 Personal history of other specified conditions: Secondary | ICD-10-CM

## 2017-11-12 DIAGNOSIS — R921 Mammographic calcification found on diagnostic imaging of breast: Secondary | ICD-10-CM

## 2017-12-04 ENCOUNTER — Other Ambulatory Visit: Payer: Managed Care, Other (non HMO)

## 2017-12-07 ENCOUNTER — Ambulatory Visit
Admission: RE | Admit: 2017-12-07 | Discharge: 2017-12-07 | Disposition: A | Discharge status: Home / Self Care | Payer: Managed Care, Other (non HMO) | Source: Ambulatory Visit | Attending: Obstetrics & Gynecology | Admitting: Obstetrics & Gynecology

## 2017-12-07 DIAGNOSIS — R921 Mammographic calcification found on diagnostic imaging of breast: Secondary | ICD-10-CM

## 2017-12-07 DIAGNOSIS — Z87898 Personal history of other specified conditions: Secondary | ICD-10-CM

## 2018-02-18 ENCOUNTER — Other Ambulatory Visit: Payer: Self-pay | Admitting: "Endocrinology

## 2018-02-18 DIAGNOSIS — E0590 Thyrotoxicosis, unspecified without thyrotoxic crisis or storm: Secondary | ICD-10-CM

## 2018-03-01 ENCOUNTER — Encounter
Admission: RE | Admit: 2018-03-01 | Discharge: 2018-03-01 | Disposition: A | Discharge status: Home / Self Care | Payer: Managed Care, Other (non HMO) | Source: Ambulatory Visit | Attending: "Endocrinology | Admitting: "Endocrinology

## 2018-03-01 DIAGNOSIS — E0590 Thyrotoxicosis, unspecified without thyrotoxic crisis or storm: Secondary | ICD-10-CM

## 2018-03-01 MED ORDER — SODIUM IODIDE I-123 7.4 MBQ CAPS
151.4330 | ORAL_CAPSULE | Freq: Once | ORAL | Status: AC
  Administered 2018-03-01: 151.433 via ORAL

## 2018-03-02 ENCOUNTER — Encounter
Admission: RE | Admit: 2018-03-02 | Discharge: 2018-03-02 | Disposition: A | Discharge status: Home / Self Care | Payer: Managed Care, Other (non HMO) | Source: Ambulatory Visit | Attending: "Endocrinology | Admitting: "Endocrinology

## 2018-03-12 ENCOUNTER — Encounter
Admission: RE | Admit: 2018-03-12 | Discharge: 2018-03-12 | Disposition: A | Discharge status: Home / Self Care | Payer: Managed Care, Other (non HMO) | Source: Ambulatory Visit | Attending: "Endocrinology | Admitting: "Endocrinology

## 2018-03-12 DIAGNOSIS — E0590 Thyrotoxicosis, unspecified without thyrotoxic crisis or storm: Secondary | ICD-10-CM

## 2018-03-12 MED ORDER — SODIUM IODIDE I 131 CAPSULE
29.6900 | Freq: Once | INTRAVENOUS | Status: AC | PRN
  Administered 2018-03-12: 29.69 via ORAL

## 2018-11-26 ENCOUNTER — Other Ambulatory Visit: Payer: Self-pay | Admitting: Obstetrics & Gynecology

## 2018-11-26 DIAGNOSIS — Z1231 Encounter for screening mammogram for malignant neoplasm of breast: Secondary | ICD-10-CM

## 2018-12-14 ENCOUNTER — Ambulatory Visit
Admission: RE | Admit: 2018-12-14 | Discharge: 2018-12-14 | Disposition: A | Discharge status: Home / Self Care | Payer: 59 | Source: Ambulatory Visit | Attending: Obstetrics & Gynecology | Admitting: Obstetrics & Gynecology

## 2018-12-14 DIAGNOSIS — Z1231 Encounter for screening mammogram for malignant neoplasm of breast: Secondary | ICD-10-CM | POA: Diagnosis present

## 2018-12-21 ENCOUNTER — Other Ambulatory Visit: Payer: Self-pay | Admitting: Student

## 2018-12-21 DIAGNOSIS — R748 Abnormal levels of other serum enzymes: Secondary | ICD-10-CM

## 2018-12-21 DIAGNOSIS — K760 Fatty (change of) liver, not elsewhere classified: Secondary | ICD-10-CM

## 2018-12-29 ENCOUNTER — Ambulatory Visit
Admission: RE | Admit: 2018-12-29 | Discharge: 2018-12-29 | Disposition: A | Discharge status: Home / Self Care | Payer: 59 | Source: Ambulatory Visit | Attending: Student | Admitting: Student

## 2018-12-29 DIAGNOSIS — R748 Abnormal levels of other serum enzymes: Secondary | ICD-10-CM | POA: Insufficient documentation

## 2018-12-29 DIAGNOSIS — K760 Fatty (change of) liver, not elsewhere classified: Secondary | ICD-10-CM

## 2018-12-29 LAB — POCT I-STAT CREATININE: Creatinine, Ser: 0.7 mg/dL (ref 0.44–1.00)

## 2018-12-29 MED ORDER — GADOBUTROL 1 MMOL/ML IV SOLN
7.0000 mL | Freq: Once | INTRAVENOUS | Status: AC | PRN
  Administered 2018-12-29: 7 mL via INTRAVENOUS

## 2019-12-06 ENCOUNTER — Other Ambulatory Visit: Payer: Self-pay | Admitting: Obstetrics & Gynecology

## 2019-12-06 DIAGNOSIS — Z1231 Encounter for screening mammogram for malignant neoplasm of breast: Secondary | ICD-10-CM

## 2020-01-02 ENCOUNTER — Ambulatory Visit
Admission: RE | Admit: 2020-01-02 | Discharge: 2020-01-02 | Disposition: A | Discharge status: Home / Self Care | Payer: 59 | Source: Ambulatory Visit | Attending: Obstetrics & Gynecology | Admitting: Obstetrics & Gynecology

## 2020-01-02 DIAGNOSIS — Z1231 Encounter for screening mammogram for malignant neoplasm of breast: Secondary | ICD-10-CM

## 2020-08-02 IMAGING — MR MR ABDOMEN WO/W CM
16 of 17 series · 44 of 48 positions shown · IV contrast (7 GADAVIST)
Comparison: None.

CLINICAL DATA: Elevated liver function tests and alkaline
phosphatase level.

EXAM:
MRI ABDOMEN WITHOUT AND WITH CONTRAST
TECHNIQUE: Multiplanar multisequence MR imaging of the abdomen was performed
both before and after the administration of intravenous contrast.
CONTRAST:  7 mL Gadavist

[Series 2: cor ssfse / · coronal · 7.0mm · 1.48mm/px · 2 of 33 slices shown]
[im 1/33]
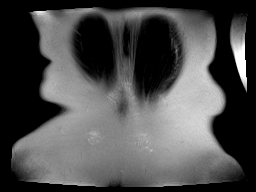
[im 33/33]
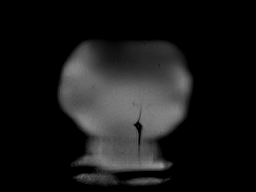

[Series 3: T2 fat-sat · axial · 6.0mm · 1.48mm/px · z∈[-132,+91]mm · 2 of 32 slices shown]
[im 1/32]
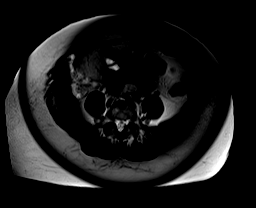
[im 32/32]
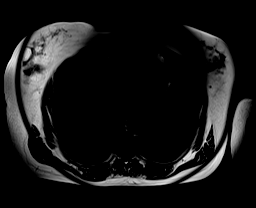

[Series 4: T1 · axial · 6.0mm · 0.74mm/px · z∈[-132,+91]mm · 4 of 64 slices shown]
[im 1/64]
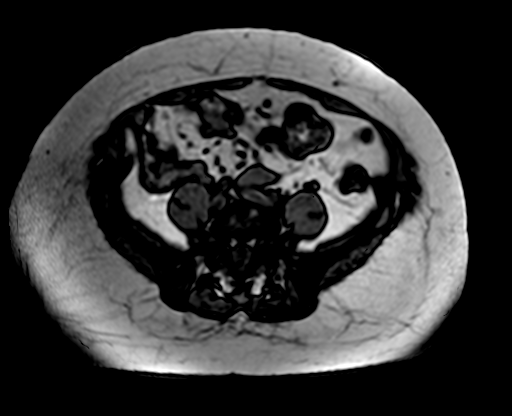
[im 22/64]
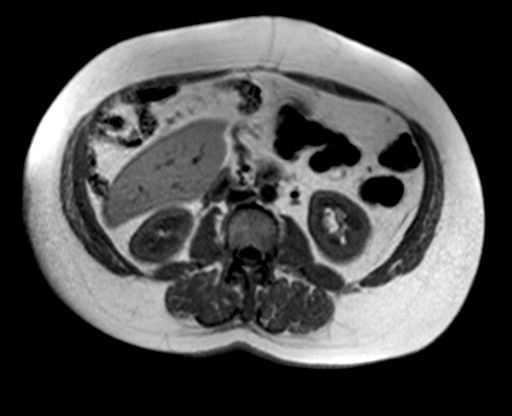
[im 43/64]
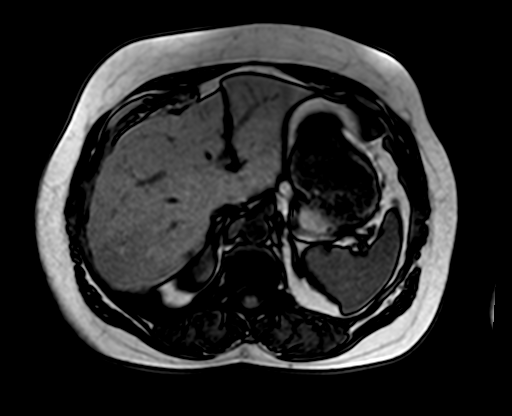
[im 64/64]
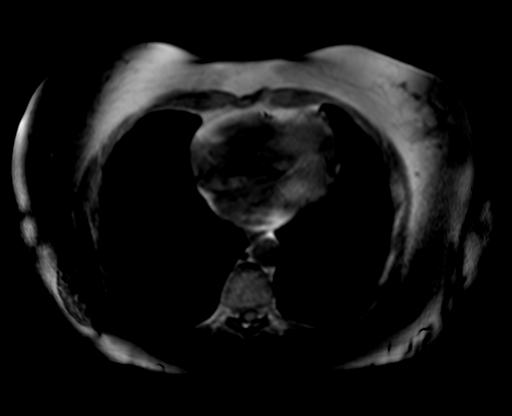

[Series 5: DWI · axial · 6.0mm · 2.00mm/px · z∈[-132,+91]mm · 5 of 96 slices shown (1 of 2)]
[im 1/96]
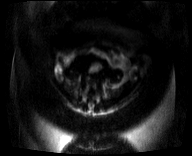
[im 24/96]
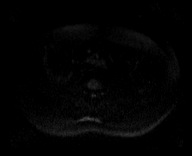
[im 48/96]
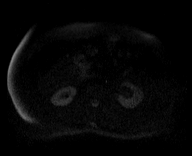
[im 72/96]
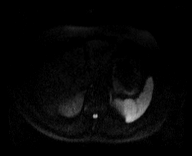
[im 96/96]
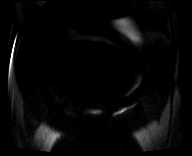

[Series 6: DWI · axial · 6.0mm · 2.00mm/px · z∈[-132,+91]mm · 2 of 32 slices shown (2 of 2)]
[im 1/32]
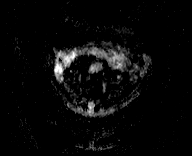
[im 32/32]
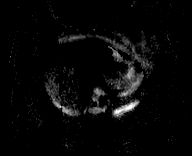

[Series 7: bSSFP · axial · 6.0mm · 0.74mm/px · 1 of 32 slices shown]
[im 1/32]
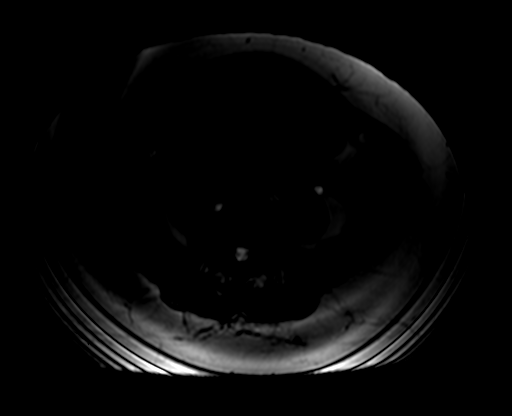

[Series 8: axial dynamic pre · axial · non-contrast · 3.0mm · 1.19mm/px · z∈[-127,+86]mm · 3 of 72 slices shown]
[im 1/72]
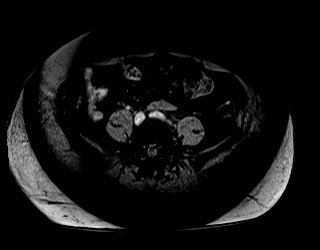
[im 36/72]
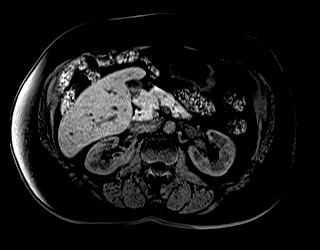
[im 72/72]
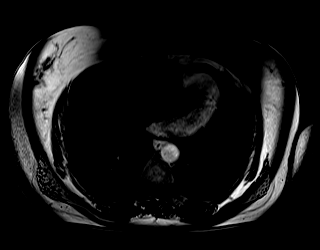

[Series 9: axial dynamic post · axial · 3.0mm · 1.19mm/px · z∈[-127,+86]mm · 3 of 72 slices shown (1 of 6)]
[im 1/72]
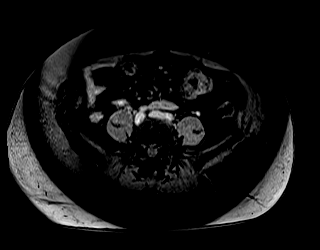
[im 36/72]
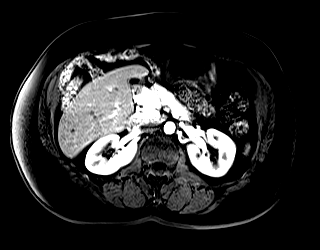
[im 72/72]
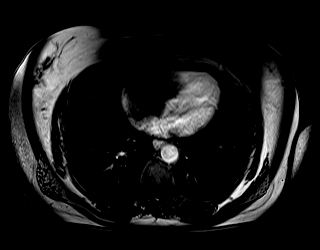

[Series 10: axial dynamic post · axial · 3.0mm · 1.19mm/px · z∈[-127,+86]mm · 3 of 72 slices shown (2 of 6)]
[im 1/72]
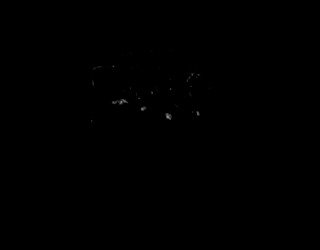
[im 36/72]
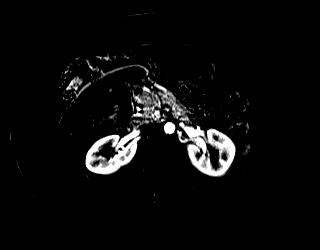
[im 72/72]
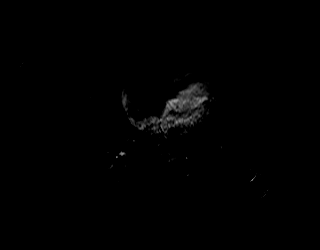

[Series 11: axial dynamic post · axial · 3.0mm · 1.19mm/px · z∈[-127,+86]mm · 3 of 72 slices shown (3 of 6)]
[im 1/72]
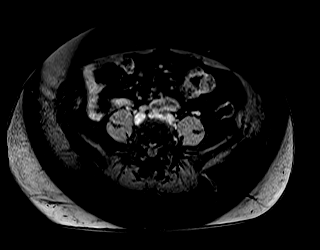
[im 36/72]
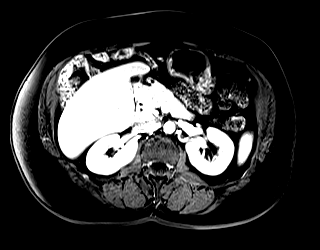
[im 72/72]
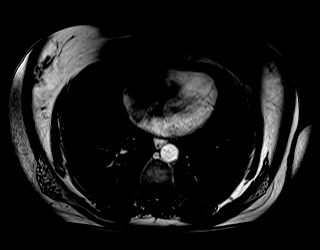

[Series 12: axial dynamic post · axial · 3.0mm · 1.19mm/px · z∈[-127,+86]mm · 3 of 72 slices shown (4 of 6)]
[im 1/72]
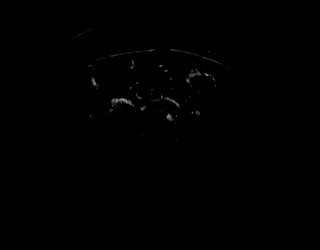
[im 36/72]
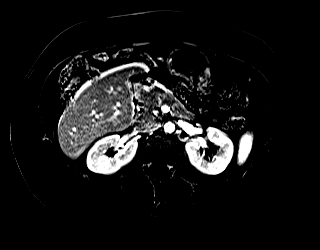
[im 72/72]
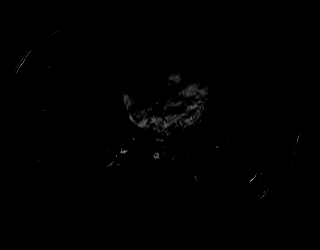

[Series 13: axial dynamic post · axial · 3.0mm · 1.19mm/px · z∈[-127,+86]mm · 3 of 72 slices shown (5 of 6)]
[im 1/72]
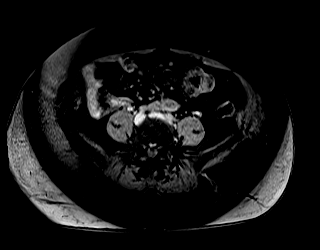
[im 36/72]
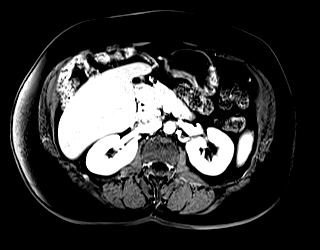
[im 72/72]
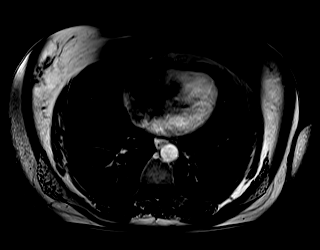

[Series 14: axial dynamic post · axial · 3.0mm · 1.19mm/px · z∈[-127,+86]mm · 3 of 72 slices shown (6 of 6)]
[im 1/72]
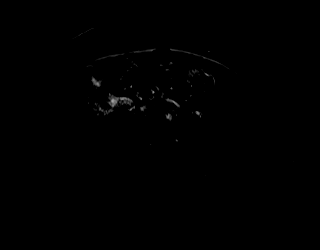
[im 36/72]
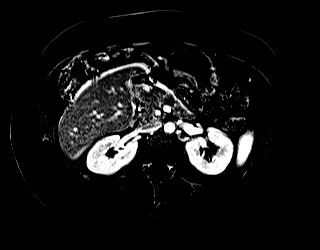
[im 72/72]
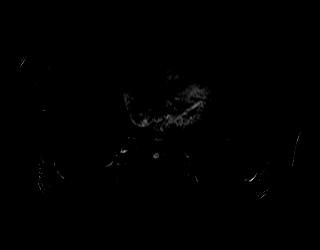

[Series 16: axial ssfse / · axial · 6.0mm · 1.19mm/px · 1 of 32 slices shown]
[im 1/32]
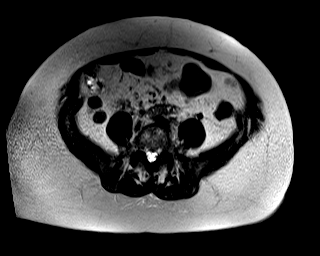

[Series 17: axial dynamic delayed · axial · 3.0mm · 1.19mm/px · z∈[-127,+86]mm · 3 of 72 slices shown]
[im 1/72]
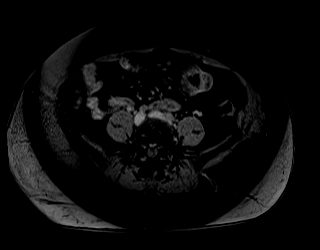
[im 36/72]
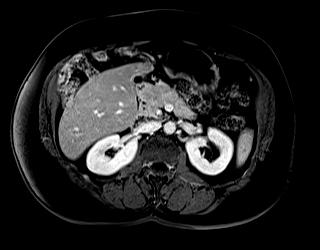
[im 72/72]
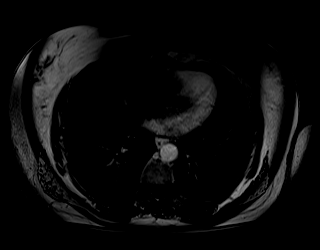

[Series 18: axial dynamic delayed_sub · axial · 3.0mm · 1.19mm/px · z∈[-127,+86]mm · 3 of 72 slices shown]
[im 1/72]
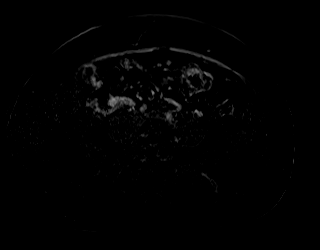
[im 36/72]
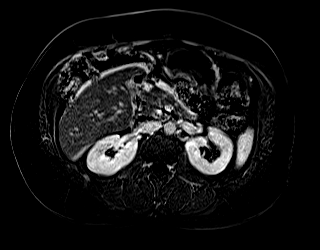
[im 72/72]
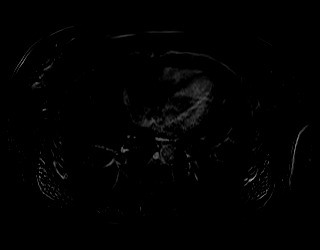

[44 of 48 positions shown; findings below may reference images not displayed]

FINDINGS: Lower chest: No acute findings.

Hepatobiliary: No hepatic masses identified. No evidence of
steatosis on chemical shift imaging. Gallbladder is unremarkable. No
evidence of biliary ductal dilatation.

Pancreas: No mass or inflammatory changes. No evidence of pancreatic
ductal dilatation.

Spleen:  Within normal limits in size and appearance.

Adrenals/Urinary Tract: No masses identified. Tiny sub-cm
proteinaceous or hemorrhagic cyst noted in lower pole of left
kidney. No evidence of hydronephrosis.

Stomach/Bowel: Visualized portion unremarkable.

Vascular/Lymphatic: No pathologically enlarged lymph nodes
identified. No abdominal aortic aneurysm.

Other:  None.

Musculoskeletal:  No suspicious bone lesions identified.
IMPRESSION: Negative. No hepatobiliary or other significant abnormality
identified.

## 2020-12-25 ENCOUNTER — Other Ambulatory Visit: Payer: Self-pay | Admitting: Obstetrics & Gynecology

## 2020-12-25 DIAGNOSIS — Z1231 Encounter for screening mammogram for malignant neoplasm of breast: Secondary | ICD-10-CM

## 2021-01-08 ENCOUNTER — Ambulatory Visit
Admission: RE | Admit: 2021-01-08 | Discharge: 2021-01-08 | Disposition: A | Discharge status: Home / Self Care | Payer: 59 | Source: Ambulatory Visit | Attending: Obstetrics & Gynecology | Admitting: Obstetrics & Gynecology

## 2021-01-08 ENCOUNTER — Other Ambulatory Visit: Payer: Self-pay

## 2021-01-08 DIAGNOSIS — Z1231 Encounter for screening mammogram for malignant neoplasm of breast: Secondary | ICD-10-CM | POA: Diagnosis present

## 2022-06-06 ENCOUNTER — Other Ambulatory Visit: Payer: Self-pay | Admitting: Internal Medicine

## 2022-06-06 DIAGNOSIS — Z1231 Encounter for screening mammogram for malignant neoplasm of breast: Secondary | ICD-10-CM

## 2022-06-16 ENCOUNTER — Ambulatory Visit: Payer: 59 | Admitting: Dermatology

## 2022-06-24 ENCOUNTER — Ambulatory Visit (INDEPENDENT_AMBULATORY_CARE_PROVIDER_SITE_OTHER): Payer: No Typology Code available for payment source | Admitting: Dermatology

## 2022-06-24 DIAGNOSIS — D225 Melanocytic nevi of trunk: Secondary | ICD-10-CM

## 2022-06-24 DIAGNOSIS — D489 Neoplasm of uncertain behavior, unspecified: Secondary | ICD-10-CM

## 2022-06-24 DIAGNOSIS — D239 Other benign neoplasm of skin, unspecified: Secondary | ICD-10-CM

## 2022-06-24 DIAGNOSIS — L719 Rosacea, unspecified: Secondary | ICD-10-CM | POA: Diagnosis not present

## 2022-06-24 DIAGNOSIS — L820 Inflamed seborrheic keratosis: Secondary | ICD-10-CM

## 2022-06-24 DIAGNOSIS — D485 Neoplasm of uncertain behavior of skin: Secondary | ICD-10-CM | POA: Diagnosis not present

## 2022-06-24 DIAGNOSIS — D229 Melanocytic nevi, unspecified: Secondary | ICD-10-CM

## 2022-06-24 HISTORY — DX: Melanocytic nevi, unspecified: D22.9

## 2022-06-24 HISTORY — DX: Other benign neoplasm of skin, unspecified: D23.9

## 2022-06-24 NOTE — Progress Notes (Signed)
New Patient Visit  Subjective  Roberta Gill is a 66 y.o. female who presents for the following: New Patient (Initial Visit) (Here today concerning spots on back, right palm of hand, right wrist, face, left forehead, left shoulder, back, and right lower leg. ). Spot on wrist itches.  The patient has spots, moles and lesions to be evaluated, some may be new or changing and the patient has concerns that these could be cancer.   The following portions of the chart were reviewed this encounter and updated as appropriate:       Review of Systems:  No other skin or systemic complaints except as noted in HPI or Assessment and Plan.  Objective  Well appearing patient in no apparent distress; mood and affect are within normal limits.  A focused examination was performed including face, chest, back, b/l arms, back, right lower leg. Relevant physical exam findings are noted in the Assessment and Plan.  right wrist x 1 Erythematous stuck-on, waxy papule  face Mid face erythema with telangiectasias plus scattered inflammatory papules.   right palm 4 mm speckled brown macule        left posterior shoulder 5 mm two toned brown macule      left spinal mid upper back 4 mm speckled brown macule      spinal upper back 3 mm flesh brown papule            left lateral forehead 3 mm blue macule     Assessment & Plan  Inflamed seborrheic keratosis right wrist x 1  Symptomatic, irritating, patient would like treated.  Destruction of lesion - right wrist x 1  Destruction method: cryotherapy   Informed consent: discussed and consent obtained   Lesion destroyed using liquid nitrogen: Yes   Region frozen until ice ball extended beyond lesion: Yes   Outcome: patient tolerated procedure well with no complications   Post-procedure details: wound care instructions given   Additional details:  Prior to procedure, discussed risks of blister formation, small wound,  skin dyspigmentation, or rare scar following cryotherapy. Recommend Vaseline ointment to treated areas while healing.   Rosacea face  Chronic and persistent condition with duration or expected duration over one year. Condition is bothersome/symptomatic for patient. Currently flared.   Rosacea is a chronic progressive skin condition usually affecting the face of adults, causing redness and/or acne bumps. It is treatable but not curable. It sometimes affects the eyes (ocular rosacea) as well. It may respond to topical and/or systemic medication and can flare with stress, sun exposure, alcohol, exercise and some foods.  Daily application of broad spectrum spf 30+ sunscreen to face is recommended to reduce flares.  Discussed treatment options including Rx creams  Patient deferred treatment at this time.  Recommend daily moisturizer with sunscreen    Neoplasm of uncertain behavior (4) right palm  Epidermal / dermal shaving  Lesion diameter (cm):  0.5 Informed consent: discussed and consent obtained   Patient was prepped and draped in usual sterile fashion: Area prepped with alcohol. Anesthesia: the lesion was anesthetized in a standard fashion   Anesthetic:  1% lidocaine w/ epinephrine 1-100,000 buffered w/ 8.4% NaHCO3 Instrument used: flexible razor blade   Hemostasis achieved with: pressure, aluminum chloride and electrodesiccation   Outcome: patient tolerated procedure well   Post-procedure details: wound care instructions given   Post-procedure details comment:  Ointment and small bandage applied.  Additional details:     Specimen 1 - Surgical pathology Differential Diagnosis: nevus  r/o dyplasia   Check Margins: No  left posterior shoulder  Epidermal / dermal shaving  Lesion diameter (cm):  0.8 Informed consent: discussed and consent obtained   Patient was prepped and draped in usual sterile fashion: Area prepped with alcohol. Anesthesia: the lesion was anesthetized in a  standard fashion   Anesthetic:  1% lidocaine w/ epinephrine 1-100,000 buffered w/ 8.4% NaHCO3 Instrument used: flexible razor blade   Hemostasis achieved with: pressure, aluminum chloride and electrodesiccation   Outcome: patient tolerated procedure well   Post-procedure details: wound care instructions given   Post-procedure details comment:  Ointment and small bandage applied.   Specimen 2 - Surgical pathology Differential Diagnosis: sk vs lentigo r/o atypia   Check Margins: No  left spinal mid upper back  Epidermal / dermal shaving  Lesion diameter (cm):  0.5 Informed consent: discussed and consent obtained   Patient was prepped and draped in usual sterile fashion: Area prepped with alcohol. Anesthesia: the lesion was anesthetized in a standard fashion   Anesthetic:  1% lidocaine w/ epinephrine 1-100,000 buffered w/ 8.4% NaHCO3 Instrument used: flexible razor blade   Hemostasis achieved with: pressure, aluminum chloride and electrodesiccation   Outcome: patient tolerated procedure well   Post-procedure details: wound care instructions given   Post-procedure details comment:  Ointment and small bandage applied.   Specimen 3 - Surgical pathology Differential Diagnosis: Nevus r/o dysplasia   Check Margins: No  spinal upper back  Epidermal / dermal shaving  Lesion diameter (cm):  0.4 Informed consent: discussed and consent obtained   Patient was prepped and draped in usual sterile fashion: Area prepped with alcohol. Anesthesia: the lesion was anesthetized in a standard fashion   Anesthetic:  1% lidocaine w/ epinephrine 1-100,000 buffered w/ 8.4% NaHCO3 Instrument used: flexible razor blade   Hemostasis achieved with: pressure, aluminum chloride and electrodesiccation   Outcome: patient tolerated procedure well   Post-procedure details: wound care instructions given   Post-procedure details comment:  Ointment and small bandage applied.   Specimen 4 - Surgical  pathology Differential Diagnosis: Irritated nevus r/o dysplasia   Check Margins: No  Nevus r/o dysplasia   Sk vs lentigo r/o atypia   Nevus r/o dysplasia   Irritated nevus r/o dysplasia   Blue nevus left lateral forehead  Blue nevus vs hemangioma   Benign-appearing.  Observation.  Call clinic for new or changing lesions.  Recommend daily use of broad spectrum spf 30+ sunscreen to sun-exposed areas.     Dermatofibroma - Firm pink/brown papulenodule with dimple sign right forearm , left forearm  - Benign appearing - Call for any changes  Return pending biopsy.   I, Ruthell Rummage, CMA, am acting as scribe for Brendolyn Patty, MD.  Documentation: I have reviewed the above documentation for accuracy and completeness, and I agree with the above.  Brendolyn Patty MD

## 2022-06-24 NOTE — Patient Instructions (Addendum)
Biopsy Wound Care Instructions  Leave the original bandage on for 24 hours if possible.  If the bandage becomes soaked or soiled before that time, it is OK to remove it and examine the wound.  A small amount of post-operative bleeding is normal.  If excessive bleeding occurs, remove the bandage, place gauze over the site and apply continuous pressure (no peeking) over the area for 30 minutes. If this does not work, please call our clinic as soon as possible or page your doctor if it is after hours.   Once a day, cleanse the wound with soap and water. It is fine to shower. If a thick crust develops you may use a Q-tip dipped into dilute hydrogen peroxide (mix 1:1 with water) to dissolve it.  Hydrogen peroxide can slow the healing process, so use it only as needed.    After washing, apply petroleum jelly (Vaseline) or an antibiotic ointment if your doctor prescribed one for you, followed by a bandage.    For best healing, the wound should be covered with a layer of ointment at all times. If you are not able to keep the area covered with a bandage to hold the ointment in place, this may mean re-applying the ointment several times a day.  Continue this wound care until the wound has healed and is no longer open.   Itching and mild discomfort is normal during the healing process. However, if you develop pain or severe itching, please call our office.   If you have any discomfort, you can take Tylenol (acetaminophen) or ibuprofen as directed on the bottle. (Please do not take these if you have an allergy to them or cannot take them for another reason).  Some redness, tenderness and white or yellow material in the wound is normal healing.  If the area becomes very sore and red, or develops a thick yellow-green material (pus), it may be infected; please notify us.    If you have stitches, return to clinic as directed to have the stitches removed. You will continue wound care for 2-3 days after the stitches  are removed.   Wound healing continues for up to one year following surgery. It is not unusual to experience pain in the scar from time to time during the interval.  If the pain becomes severe or the scar thickens, you should notify the office.    A slight amount of redness in a scar is expected for the first six months.  After six months, the redness will fade and the scar will soften and fade.  The color difference becomes less noticeable with time.  If there are any problems, return for a post-op surgery check at your earliest convenience.  To improve the appearance of the scar, you can use silicone scar gel, cream, or sheets (such as Mederma or Serica) every night for up to one year. These are available over the counter (without a prescription).  Please call our office at [phone] for any questions or concerns.         A dermatofibroma is a benign growth possibly related to trauma, such as an insect bite or inflamed acne-type bump.  Discussed removal (shave vrs excision) with resulting scar and risk of recurrence.  Since not bothersome, will observe for now.   Seborrheic Keratosis  What causes seborrheic keratoses? Seborrheic keratoses are harmless, common skin growths that first appear during adult life.  As time goes by, more growths appear.  Some people may develop a large  number of them.  Seborrheic keratoses appear on both covered and uncovered body parts.  They are not caused by sunlight.  The tendency to develop seborrheic keratoses can be inherited.  They vary in color from skin-colored to gray, brown, or even black.  They can be either smooth or have a rough, warty surface.   Seborrheic keratoses are superficial and look as if they were stuck on the skin.  Under the microscope this type of keratosis looks like layers upon layers of skin.  That is why at times the top layer may seem to fall off, but the rest of the growth remains and re-grows.    Treatment Seborrheic  keratoses do not need to be treated, but can easily be removed in the office.  Seborrheic keratoses often cause symptoms when they rub on clothing or jewelry.  Lesions can be in the way of shaving.  If they become inflamed, they can cause itching, soreness, or burning.  Removal of a seborrheic keratosis can be accomplished by freezing, burning, or surgery. If any spot bleeds, scabs, or grows rapidly, please return to have it checked, as these can be an indication of a skin cancer.  Cryotherapy Aftercare  Wash gently with soap and water everyday.   Apply Vaseline and Band-Aid daily until healed.    Due to recent changes in healthcare laws, you may see results of your pathology and/or laboratory studies on MyChart before the doctors have had a chance to review them. We understand that in some cases there may be results that are confusing or concerning to you. Please understand that not all results are received at the same time and often the doctors may need to interpret multiple results in order to provide you with the best plan of care or course of treatment. Therefore, we ask that you please give [place] 2 business days to thoroughly review all your results before contacting the office for clarification. Should we see a critical lab result, you will be contacted sooner.   If You Need Anything After Your Visit  If you have any questions or concerns for your doctor, please call our main line at [phone] and press option 4 to reach your doctor's medical assistant. If no one answers, please leave a voicemail as directed and we will return your call as soon as possible. Messages left after 4 pm will be answered the following business day.   You may also send [place] a message via [place]. We typically respond to MyChart messages within 1-2 business days.  For prescription refills, please ask your pharmacy to contact our office. Our fax number is [phone].  If you have an urgent issue when the clinic is  closed that cannot wait until the next business day, you can page your doctor at the number below.    Please note that while we do our best to be available for urgent issues outside of office hours, we are not available 24/7.   If you have an urgent issue and are unable to reach [place], you may choose to seek medical care at your doctor's office, retail clinic, urgent care center, or emergency room.  If you have a medical emergency, please immediately call 911 or go to the emergency department.  Pager Numbers  - Dr. Nehemiah Massed: [phone]  - Dr. Laurence Ferrari: [phone]  - Dr. Nicole Kindred: [phone]  In the event of inclement weather, please call our main line at [phone] for an update on the status of any delays or closures.  Dermatology Medication Tips: Please keep the boxes that topical medications come in in order to help keep track of the instructions about where and how to use these. Pharmacies typically print the medication instructions only on the boxes and not directly on the medication tubes.   If your medication is too expensive, please contact our office at [phone] option 4 or send [place] a message through [place].   We are unable to tell what your co-pay for medications will be in advance as this is different depending on your insurance coverage. However, we may be able to find a substitute medication at lower cost or fill out paperwork to get insurance to cover a needed medication.   If a prior authorization is required to get your medication covered by your insurance company, please allow [place] 1-2 business days to complete this process.  Drug prices often vary depending on where the prescription is filled and some pharmacies may offer cheaper prices.  The website www.goodrx.com contains coupons for medications through different pharmacies. The prices here do not account for what the cost may be with help from insurance (it may be cheaper with your insurance), but the website can  give you the price if you did not use any insurance.  - You can print the associated coupon and take it with your prescription to the pharmacy.  - You may also stop by our office during regular business hours and pick up a GoodRx coupon card.  - If you need your prescription sent electronically to a different pharmacy, notify our office through [facility] or by phone at [phone] option 4.     Si Usted Necesita Algo Despus de Su Visita  Tambin puede enviarnos un mensaje a travs de Pharmacist, community. Por lo general respondemos a los mensajes de MyChart en el transcurso de 1 a 2 das hbiles.  Para renovar recetas, por favor pida a su farmacia que se ponga en contacto con nuestra oficina. Harland Dingwall de fax es [place] [phone].  Si tiene un asunto urgente cuando la clnica est cerrada y que no puede esperar hasta el siguiente da hbil, puede llamar/localizar a su doctor(a) al nmero que aparece a continuacin.   Por favor, tenga en cuenta que aunque hacemos todo lo posible para estar disponibles para asuntos urgentes fuera del horario de [place], no estamos disponibles las 24 horas del da, los 7 das de la [place].   Si tiene un problema urgente y no puede comunicarse con nosotros, puede optar por buscar atencin mdica  en el consultorio de su doctor(a), en una clnica privada, en un centro de atencin urgente o en una sala de emergencias.  Si tiene Engineering geologist, por favor llame inmediatamente al 911 o vaya a la sala de emergencias.  Nmeros de bper  - Dr. Nehemiah Massed: [phone]  - Dra. Moye: [phone]  - Dra. Nicole Kindred: [phone]  En caso de inclemencias del [place], por favor llame a Johnsie Kindred principal al [phone] para una actualizacin sobre el [place] de cualquier retraso o cierre.  Consejos para la medicacin en dermatologa: Por favor, guarde las cajas en las que vienen los medicamentos de uso tpico para ayudarle a seguir las instrucciones sobre  dnde y cmo usarlos. Las farmacias generalmente imprimen las instrucciones del medicamento slo en las cajas y no directamente en los tubos del [place].   Si su medicamento es muy caro, por favor, pngase en contacto con Zigmund Daniel llamando al [phone] y presione la opcin 4 o envenos un  mensaje a travs de MyChart.   No podemos decirle cul ser su copago por los medicamentos por adelantado ya que esto es diferente dependiendo de la cobertura de su seguro. Sin embargo, es posible que podamos encontrar un medicamento sustituto a Electrical engineer un formulario para que el seguro cubra el medicamento que se considera necesario.   Si se requiere una autorizacin previa para que su compaa de seguros [place] su medicamento, por favor permtanos de 1 a 2 das hbiles para completar este proceso.  Los precios de los medicamentos varan con frecuencia dependiendo del Environmental consultant de dnde se surte la receta y alguna farmacias pueden ofrecer precios ms baratos.  El sitio web www.goodrx.com tiene cupones para medicamentos de Airline pilot. Los precios aqu no tienen en cuenta lo que podra costar con la ayuda del seguro (puede ser ms barato con su seguro), pero el sitio web puede darle el precio si no utiliz Research scientist (physical sciences).  - Puede imprimir el cupn correspondiente y llevarlo con su receta a la farmacia.  - Tambin puede pasar por nuestra oficina durante el horario de atencin regular y Charity fundraiser una tarjeta de cupones de GoodRx.  - Si necesita que su receta se enve electrnicamente a una farmacia diferente, informe a nuestra oficina a travs de MyChart de [place] o por telfono llamando al [phone] y presione la opcin 4.

## 2022-06-30 ENCOUNTER — Telehealth: Payer: Self-pay

## 2022-06-30 NOTE — Telephone Encounter (Signed)
Advised pt of bx results and scheduled pt for surgery./sh 

## 2022-06-30 NOTE — Telephone Encounter (Signed)
-----   Message from Brendolyn Patty, MD sent at 06/30/2022 12:46 PM EDT ----- 1. Skin , right palm ATYPICAL INTRAEPIDERMAL MELANOCYTIC PROLIFERATION, PERIPHERAL MARGIN INVOLVED 2. 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 CLOSE TO MARGIN 3. Skin , left spinal mid upper back DYSPLASTIC COMPOUND NEVUS WITH MODERATE ATYPIA, CLOSE TO MARGIN 4. Skin , spinal upper back MELANOCYTIC NEVUS, INTRADERMAL TYPE, BASE INVOLVED   1. Atypical pigmented lesion- needs repeat scoop shave 2. Severely atypical mole- needs excision 3. Moderately atypical mole- observation 4. Benign mole - please call patient

## 2022-07-21 ENCOUNTER — Encounter: Payer: Self-pay | Admitting: Dermatology

## 2022-07-21 ENCOUNTER — Ambulatory Visit (INDEPENDENT_AMBULATORY_CARE_PROVIDER_SITE_OTHER): Payer: No Typology Code available for payment source | Admitting: Dermatology

## 2022-07-21 DIAGNOSIS — D492 Neoplasm of unspecified behavior of bone, soft tissue, and skin: Secondary | ICD-10-CM

## 2022-07-21 DIAGNOSIS — D229 Melanocytic nevi, unspecified: Secondary | ICD-10-CM

## 2022-07-21 DIAGNOSIS — D485 Neoplasm of uncertain behavior of skin: Secondary | ICD-10-CM | POA: Diagnosis not present

## 2022-07-21 NOTE — Progress Notes (Signed)
   Follow-Up Visit   Subjective  Roberta Gill is a 66 y.o. female who presents for the following: Severe Dysplastic nevus bx proven (L post shoulder, pt presents for excision today).   The following portions of the chart were reviewed this encounter and updated as appropriate:       Review of Systems:  No other skin or systemic complaints except as noted in HPI or Assessment and Plan.  Objective  Well appearing patient in no apparent distress; mood and affect are within normal limits.  A focused examination was performed including back. Relevant physical exam findings are noted in the Assessment and Plan.  Left Upper Back Pink bx site 1.0 x 0.7cm  R palm Pink bx site    Assessment & Plan  Neoplasm of skin Left Upper Back  Skin excision  Lesion length (cm):  1 Lesion width (cm):  0.7 Margin per side (cm):  0.2 Total excision diameter (cm):  1.4 Informed consent: discussed and consent obtained   Timeout: patient name, date of birth, surgical site, and procedure verified   Procedure prep:  Patient was prepped and draped in usual sterile fashion Prep type:  Povidone-iodine Anesthesia: the lesion was anesthetized in a standard fashion   Anesthetic:  1% lidocaine w/ epinephrine 1-100,000 buffered w/ 8.4% NaHCO3 (6cc lido w/ epi, 6cc bupivicaine, Total of 12cc) Instrument used: #15 blade   Hemostasis achieved with: pressure and electrodesiccation   Outcome: patient tolerated procedure well with no complications   Additional details:  Tagged 12 o'clock superior  Skin repair Complexity:  Intermediate Final length (cm):  3.2 Informed consent: discussed and consent obtained   Timeout: patient name, date of birth, surgical site, and procedure verified   Reason for type of repair: reduce tension to allow closure, reduce the risk of dehiscence, infection, and necrosis, reduce subcutaneous dead space and avoid a hematoma, preserve normal anatomical and functional  relationships and enhance both functionality and cosmetic results   Undermining: edges undermined   Subcutaneous layers (deep stitches):  Suture size:  3-0 Suture type: Vicryl (polyglactin 910)   Stitches:  Buried vertical mattress Fine/surface layer approximation (top stitches):  Suture size:  3-0 Suture type: nylon   Stitches: simple interrupted   Suture removal (days):  7 Hemostasis achieved with: suture Outcome: patient tolerated procedure well with no complications   Post-procedure details: sterile dressing applied and wound care instructions given   Dressing type: pressure dressing (Mupirocin)    Specimen 1 - Surgical pathology Differential Diagnosis: D48.5 Bx proven severe dysplastic nevus  Check Margins: yes Pink bx site 1.0 x 0.7cm [id number] Tagged 12 o'clock superior  Severe Dysplastic Nevus, bx proven, excised today  Atypical nevus R palm  Atypical Intraepidermal Melanocytic Proliferation, bx proven Plan shave removal on f/u   Return in about 1 week (around 07/28/2022) for suture removal and shave removal.  I, Sonya Hupman, RMA, am acting as scribe for Brendolyn Patty, MD .  Documentation: I have reviewed the above documentation for accuracy and completeness, and I agree with the above.  Brendolyn Patty MD

## 2022-07-21 NOTE — Patient Instructions (Signed)

## 2022-07-22 ENCOUNTER — Ambulatory Visit
Admission: RE | Admit: 2022-07-22 | Discharge: 2022-07-22 | Disposition: A | Discharge status: Home / Self Care | Payer: No Typology Code available for payment source | Source: Ambulatory Visit | Attending: Internal Medicine | Admitting: Internal Medicine

## 2022-07-22 ENCOUNTER — Telehealth: Payer: Self-pay

## 2022-07-22 DIAGNOSIS — Z1231 Encounter for screening mammogram for malignant neoplasm of breast: Secondary | ICD-10-CM | POA: Insufficient documentation

## 2022-07-22 NOTE — Telephone Encounter (Signed)
Pt doing ok after yesterdays surgery.Roberta Gill

## 2022-07-28 ENCOUNTER — Ambulatory Visit (INDEPENDENT_AMBULATORY_CARE_PROVIDER_SITE_OTHER): Payer: No Typology Code available for payment source | Admitting: Dermatology

## 2022-07-28 ENCOUNTER — Encounter: Payer: 59 | Admitting: Dermatology

## 2022-07-28 DIAGNOSIS — L089 Local infection of the skin and subcutaneous tissue, unspecified: Secondary | ICD-10-CM

## 2022-07-28 DIAGNOSIS — T148XXA Other injury of unspecified body region, initial encounter: Secondary | ICD-10-CM

## 2022-07-28 DIAGNOSIS — D485 Neoplasm of uncertain behavior of skin: Secondary | ICD-10-CM

## 2022-07-28 MED ORDER — DOXYCYCLINE MONOHYDRATE 100 MG PO CAPS: 100.0000 mg | ORAL_CAPSULE | Freq: Two times a day (BID) | ORAL | 0 refills | Status: AC

## 2022-07-28 NOTE — Patient Instructions (Signed)
Wound Care Instructions  Cleanse wound gently with soap and water once a day then pat dry with clean gauze. Apply a thin coat of Petrolatum (petroleum jelly, "Vaseline") over the wound (unless you have an allergy to this). We recommend that you use a new, sterile tube of Vaseline. Do not pick or remove scabs. Do not remove the yellow or white "healing tissue" from the base of the wound.  Cover the wound with fresh, clean, nonstick gauze and secure with paper tape. You may use Band-Aids in place of gauze and tape if the wound is small enough, but would recommend trimming much of the tape off as there is often too much. Sometimes Band-Aids can irritate the skin.  You should call the office for your biopsy report after 1 week if you have not already been contacted.  If you experience any problems, such as abnormal amounts of bleeding, swelling, significant bruising, significant pain, or evidence of infection, please call the office immediately.  FOR ADULT SURGERY PATIENTS: If you need something for pain relief you may take 1 extra strength Tylenol (acetaminophen) AND 2 Ibuprofen (200mg each) together every 4 hours as needed for pain. (do not take these if you are allergic to them or if you have a reason you should not take them.) Typically, you may only need pain medication for 1 to 3 days.     Due to recent changes in healthcare laws, you may see results of your pathology and/or laboratory studies on MyChart before the doctors have had a chance to review them. We understand that in some cases there may be results that are confusing or concerning to you. Please understand that not all results are received at the same time and often the doctors may need to interpret multiple results in order to provide you with the best plan of care or course of treatment. Therefore, we ask that you please give us 2 business days to thoroughly review all your results before contacting the office for clarification. Should  we see a critical lab result, you will be contacted sooner.   If You Need Anything After Your Visit  If you have any questions or concerns for your doctor, please call our main line at [phone] and press option 4 to reach your doctor's medical assistant. If no one answers, please leave a voicemail as directed and we will return your call as soon as possible. Messages left after 4 pm will be answered the following business day.   You may also send us a message via MyChart. We typically respond to MyChart messages within 1-2 business days.  For prescription refills, please ask your pharmacy to contact our office. Our fax number is [phone].  If you have an urgent issue when the clinic is closed that cannot wait until the next business day, you can page your doctor at the number below.    Please note that while we do our best to be available for urgent issues outside of office hours, we are not available 24/7.   If you have an urgent issue and are unable to reach us, you may choose to seek medical care at your doctor's office, retail clinic, urgent care center, or emergency room.  If you have a medical emergency, please immediately call 911 or go to the emergency department.  Pager Numbers  - Dr. Kowalski: [phone]  - Dr. Moye: [phone]  - Dr. Stewart: [phone]  In the event of inclement weather, please call our main line at   [phone] for an update on the status of any delays or closures.  Dermatology Medication Tips: Please keep the boxes that topical medications come in in order to help keep track of the instructions about where and how to use these. Pharmacies typically print the medication instructions only on the boxes and not directly on the medication tubes.   If your medication is too expensive, please contact our office at [phone] option 4 or send us a message through MyChart.   We are unable to tell what your co-pay for medications will be in  advance as this is different depending on your insurance coverage. However, we may be able to find a substitute medication at lower cost or fill out paperwork to get insurance to cover a needed medication.   If a prior authorization is required to get your medication covered by your insurance company, please allow us 1-2 business days to complete this process.  Drug prices often vary depending on where the prescription is filled and some pharmacies may offer cheaper prices.  The website www.goodrx.com contains coupons for medications through different pharmacies. The prices here do not account for what the cost may be with help from insurance (it may be cheaper with your insurance), but the website can give you the price if you did not use any insurance.  - You can print the associated coupon and take it with your prescription to the pharmacy.  - You may also stop by our office during regular business hours and pick up a GoodRx coupon card.  - If you need your prescription sent electronically to a different pharmacy, notify our office through [place] MyChart or by phone at [phone] option 4.     Si Usted Necesita Algo Despus de Su Visita  Tambin puede enviarnos un mensaje a travs de MyChart. Por lo general respondemos a los mensajes de MyChart en el transcurso de 1 a 2 das hbiles.  Para renovar recetas, por favor pida a su farmacia que se ponga en contacto con nuestra oficina. Nuestro nmero de fax es el [phone].  Si tiene un asunto urgente cuando la clnica est cerrada y que no puede esperar hasta el siguiente da hbil, puede llamar/localizar a su doctor(a) al nmero que aparece a continuacin.   Por favor, tenga en cuenta que aunque hacemos todo lo posible para estar disponibles para asuntos urgentes fuera del horario de oficina, no estamos disponibles las 24 horas del da, los 7 das de la semana.   Si tiene un problema urgente y no puede comunicarse con nosotros, puede  optar por buscar atencin mdica  en el consultorio de su doctor(a), en una clnica privada, en un centro de atencin urgente o en una sala de emergencias.  Si tiene una emergencia mdica, por favor llame inmediatamente al 911 o vaya a la sala de emergencias.  Nmeros de bper  - Dr. Kowalski: [phone]  - Dra. Moye: [phone]  - Dra. Stewart: [phone]  En caso de inclemencias del tiempo, por favor llame a nuestra lnea principal al [phone] para una actualizacin sobre el estado de cualquier retraso o cierre.  Consejos para la medicacin en dermatologa: Por favor, guarde las cajas en las que vienen los medicamentos de uso tpico para ayudarle a seguir las instrucciones sobre dnde y cmo usarlos. Las farmacias generalmente imprimen las instrucciones del medicamento slo en las cajas y no directamente en los tubos del medicamento.   Si su medicamento es muy caro, por favor, pngase en contacto con   nuestra oficina llamando al [phone] y presione la opcin 4 o envenos un mensaje a travs de MyChart.   No podemos decirle cul ser su copago por los medicamentos por adelantado ya que esto es diferente dependiendo de la cobertura de su seguro. Sin embargo, es posible que podamos encontrar un medicamento sustituto a menor costo o llenar un formulario para que el seguro cubra el medicamento que se considera necesario.   Si se requiere una autorizacin previa para que su compaa de seguros cubra su medicamento, por favor permtanos de 1 a 2 das hbiles para completar este proceso.  Los precios de los medicamentos varan con frecuencia dependiendo del lugar de dnde se surte la receta y alguna farmacias pueden ofrecer precios ms baratos.  El sitio web www.goodrx.com tiene cupones para medicamentos de diferentes farmacias. Los precios aqu no tienen en cuenta lo que podra costar con la ayuda del seguro (puede ser ms barato con su seguro), pero el sitio web puede darle el  precio si no utiliz ningn seguro.  - Puede imprimir el cupn correspondiente y llevarlo con su receta a la farmacia.  - Tambin puede pasar por nuestra oficina durante el horario de atencin regular y recoger una tarjeta de cupones de GoodRx.  - Si necesita que su receta se enve electrnicamente a una farmacia diferente, informe a nuestra oficina a travs de MyChart de [place] o por telfono llamando al [phone] y presione la opcin 4.  

## 2022-07-28 NOTE — Progress Notes (Signed)
   Follow-Up Visit   Subjective  Roberta Gill is a 66 y.o. female who presents for the following: Suture / Staple Removal and Procedure (Shave removal for bx proven ATYPICAL INTRAEPIDERMAL MELANOCYTIC PROLIFERATION at right palm.).   The following portions of the chart were reviewed this encounter and updated as appropriate:       Review of Systems:  No other skin or systemic complaints except as noted in HPI or Assessment and Plan.  Objective  Well appearing patient in no apparent distress; mood and affect are within normal limits.  A focused examination was performed including shoulder, hand. Relevant physical exam findings are noted in the Assessment and Plan.  right palm Pink bx site  left posterior shoulder Mild erythema inferior edge with slight crusting     Assessment & Plan  Neoplasm of uncertain behavior of skin right palm  Epidermal / dermal shaving  Lesion diameter (cm):  0.8 Informed consent: discussed and consent obtained   Timeout: patient name, date of birth, surgical site, and procedure verified   Patient was prepped and draped in usual sterile fashion: area prepped with alcohol. Anesthesia: the lesion was anesthetized in a standard fashion   Anesthetic:  1% lidocaine w/ epinephrine 1-100,000 local infiltration Instrument used: flexible razor blade   Hemostasis achieved with: pressure, aluminum chloride and electrodesiccation   Outcome: patient tolerated procedure well   Post-procedure details: wound care instructions given   Post-procedure details comment:  Ointment and a small bandage applied  Specimen 1 - Surgical pathology Differential Diagnosis: BX proven ATYPICAL INTRAEPIDERMAL MELANOCYTIC PROLIFERATION   Check Margins: yes Pink bx site [phone]  Wound infection left posterior shoulder  Start doxycycline monohydrate 100 mg BID with food x 7 weeks.   Doxycycline should be taken with food to prevent nausea. Do not lay down for 30  minutes after taking. Be cautious with sun exposure and use good sun protection while on this medication. Pregnant women should not take this medication.    doxycycline (MONODOX) 100 MG capsule - left posterior shoulder Take 1 capsule (100 mg total) by mouth 2 (two) times daily for 7 days. Take with food   Encounter for Removal of Sutures - Incision site at the left posterior shoulder is clean, dry and intact - Wound cleansed, sutures removed, wound cleansed and steri strips applied.  - Discussed pathology results showing no residual dysplastic nevus, margins free  - Patient advised to keep steri-strips dry until they fall off. - Scars remodel for a full year. - Once steri-strips fall off, patient can apply over-the-counter silicone scar cream each night to help with scar remodeling if desired. - Patient advised to call with any concerns or if they notice any new or changing lesions.  Seborrheic Keratoses - Stuck-on, waxy, tan-brown papules and/or plaques  - Benign-appearing - Discussed benign etiology and prognosis. - Observe - Call for any changes   Return in about 3 months (around 10/27/2022) for Biopsy follow up.  Graciella Belton, RMA, am acting as scribe for Brendolyn Patty, MD .  Documentation: I have reviewed the above documentation for accuracy and completeness, and I agree with the above.  Brendolyn Patty MD

## 2022-07-31 ENCOUNTER — Telehealth: Payer: Self-pay

## 2022-07-31 NOTE — Telephone Encounter (Signed)
Advised pt of bx result/sh ?

## 2022-07-31 NOTE — Telephone Encounter (Signed)
-----   Message from Brendolyn Patty, MD sent at 07/31/2022  9:10 AM EDT ----- Skin , right palm NO RESIDUAL ATYPICAL MELANOCYTIC PROLIFERATION  Margins free - please call patient

## 2022-11-18 ENCOUNTER — Ambulatory Visit: Payer: No Typology Code available for payment source | Admitting: Dermatology

## 2022-11-18 ENCOUNTER — Encounter: Payer: Self-pay | Admitting: Dermatology

## 2022-11-18 VITALS — BP 173/97 | HR 62

## 2022-11-18 DIAGNOSIS — L578 Other skin changes due to chronic exposure to nonionizing radiation: Secondary | ICD-10-CM | POA: Diagnosis not present

## 2022-11-18 DIAGNOSIS — Z86018 Personal history of other benign neoplasm: Secondary | ICD-10-CM

## 2022-11-18 DIAGNOSIS — D1801 Hemangioma of skin and subcutaneous tissue: Secondary | ICD-10-CM | POA: Diagnosis not present

## 2022-11-18 DIAGNOSIS — L719 Rosacea, unspecified: Secondary | ICD-10-CM

## 2022-11-18 NOTE — Patient Instructions (Addendum)
Recommend daily broad spectrum sunscreen SPF 30+ to sun-exposed areas, reapply every 2 hours as needed. Call for new or changing lesions.  Staying in the shade or wearing long sleeves, sun glasses (UVA+UVB protection) and wide brim hats (4-inch brim around the entire circumference of the hat) are also recommended for sun protection.       Due to recent changes in healthcare laws, you may see results of your pathology and/or laboratory studies on MyChart before the doctors have had a chance to review them. We understand that in some cases there may be results that are confusing or concerning to you. Please understand that not all results are received at the same time and often the doctors may need to interpret multiple results in order to provide you with the best plan of care or course of treatment. Therefore, we ask that you please give [place] 2 business days to thoroughly review all your results before contacting the office for clarification. Should we see a critical lab result, you will be contacted sooner.   If You Need Anything After Your Visit  If you have any questions or concerns for your doctor, please call our main line at [phone] and press option 4 to reach your doctor's medical assistant. If no one answers, please leave a voicemail as directed and we will return your call as soon as possible. Messages left after 4 pm will be answered the following business day.   You may also send [place] a message via [place]. We typically respond to MyChart messages within 1-2 business days.  For prescription refills, please ask your pharmacy to contact our office. Our fax number is [phone].  If you have an urgent issue when the clinic is closed that cannot wait until the next business day, you can page your doctor at the number below.    Please note that while we do our best to be available for urgent issues outside of office hours, we are not available 24/7.   If you have an urgent issue and  are unable to reach [place], you may choose to seek medical care at your doctor's office, retail clinic, urgent care center, or emergency room.  If you have a medical emergency, please immediately call 911 or go to the emergency department.  Pager Numbers  - Dr. Nehemiah Massed: [phone]  - Dr. Laurence Ferrari: [phone]  - Dr. Nicole Kindred: [phone]  In the event of inclement weather, please call our main line at [phone] for an update on the status of any delays or closures.  Dermatology Medication Tips: Please keep the boxes that topical medications come in in order to help keep track of the instructions about where and how to use these. Pharmacies typically print the medication instructions only on the boxes and not directly on the medication tubes.   If your medication is too expensive, please contact our office at [phone] option 4 or send [place] a message through [place].   We are unable to tell what your co-pay for medications will be in advance as this is different depending on your insurance coverage. However, we may be able to find a substitute medication at lower cost or fill out paperwork to get insurance to cover a needed medication.   If a prior authorization is required to get your medication covered by your insurance company, please allow [place] 1-2 business days to complete this process.  Drug prices often vary depending on where the prescription is filled and some pharmacies may offer cheaper prices.  The website www.goodrx.com contains coupons for medications through different pharmacies. The prices here do not account for what the cost may be with help from insurance (it may be cheaper with your insurance), but the website can give you the price if you did not use any insurance.  - You can print the associated coupon and take it with your prescription to the pharmacy.  - You may also stop by our office during regular business hours and pick up a GoodRx coupon card.  - If you need your  prescription sent electronically to a different pharmacy, notify our office through [facility] or by phone at [phone] option 4.     Si Usted Necesita Algo Despus de Su Visita  Tambin puede enviarnos un mensaje a travs de Pharmacist, community. Por lo general respondemos a los mensajes de MyChart en el transcurso de 1 a 2 das hbiles.  Para renovar recetas, por favor pida a su farmacia que se ponga en contacto con nuestra oficina. Harland Dingwall de fax es [place] [phone].  Si tiene un asunto urgente cuando la clnica est cerrada y que no puede esperar hasta el siguiente da hbil, puede llamar/localizar a su doctor(a) al nmero que aparece a continuacin.   Por favor, tenga en cuenta que aunque hacemos todo lo posible para estar disponibles para asuntos urgentes fuera del horario de [place], no estamos disponibles las 24 horas del da, los 7 das de la [place].   Si tiene un problema urgente y no puede comunicarse con nosotros, puede optar por buscar atencin mdica  en el consultorio de su doctor(a), en una clnica privada, en un centro de atencin urgente o en una sala de emergencias.  Si tiene Engineering geologist, por favor llame inmediatamente al 911 o vaya a la sala de emergencias.  Nmeros de bper  - Dr. Nehemiah Massed: [phone]  - Dra. Moye: [phone]  - Dra. Nicole Kindred: [phone]  En caso de inclemencias del [place], por favor llame a Johnsie Kindred principal al [phone] para una actualizacin sobre el [place] de cualquier retraso o cierre.  Consejos para la medicacin en dermatologa: Por favor, guarde las cajas en las que vienen los medicamentos de uso tpico para ayudarle a seguir las instrucciones sobre dnde y cmo usarlos. Las farmacias generalmente imprimen las instrucciones del medicamento slo en las cajas y no directamente en los tubos del [place].   Si su medicamento es muy caro, por favor, pngase en contacto con Zigmund Daniel llamando al [phone]  y presione la opcin 4 o envenos un mensaje a travs de Pharmacist, community.   No podemos decirle cul ser su copago por los medicamentos por adelantado ya que esto es diferente dependiendo de la cobertura de su seguro. Sin embargo, es posible que podamos encontrar un medicamento sustituto a Electrical engineer un formulario para que el seguro cubra el medicamento que se considera necesario.   Si se requiere una autorizacin previa para que su compaa de seguros [place] su medicamento, por favor permtanos de 1 a 2 das hbiles para completar este proceso.  Los precios de los medicamentos varan con frecuencia dependiendo del Environmental consultant de dnde se surte la receta y alguna farmacias pueden ofrecer precios ms baratos.  El sitio web www.goodrx.com tiene cupones para medicamentos de Airline pilot. Los precios aqu no tienen en cuenta lo que podra costar con la ayuda del seguro (puede ser ms barato con su seguro), pero el sitio web puede darle el precio si no utiliz Research scientist (physical sciences).  -  imprimir el cupn correspondiente y llevarlo con su receta a la farmacia.  - Tambin puede pasar por nuestra oficina durante el horario de atencin regular y recoger una tarjeta de cupones de GoodRx.  - Si necesita que su receta se enve electrnicamente a una farmacia diferente, informe a nuestra oficina a travs de MyChart de [place] o por telfono llamando al [phone] y presione la opcin 4.  

## 2022-11-18 NOTE — Progress Notes (Signed)
   Follow-Up Visit   Subjective  Roberta Gill is a 67 y.o. female who presents for the following: Follow-up (3 month follow up on bx at right palm).  The patient has spots, moles and lesions to be evaluated, some may be new or changing and the patient has concerns that these could be cancer.    The following portions of the chart were reviewed this encounter and updated as appropriate:      Review of Systems: No other skin or systemic complaints except as noted in HPI or Assessment and Plan.   Objective  Well appearing patient in no apparent distress; mood and affect are within normal limits.  A focused examination was performed including right palm and . Relevant physical exam findings are noted in the Assessment and Plan.  face Erythema on nose and cheeks with telangectasia    Assessment & Plan  Rosacea face  Moderate involvement  Rosacea is a chronic progressive skin condition usually affecting the face of adults, causing redness and/or acne bumps. It is treatable but not curable. It sometimes affects the eyes (ocular rosacea) as well. It may respond to topical and/or systemic medication and can flare with stress, sun exposure, alcohol, exercise, topical steroids (including hydrocortisone/cortisone 10) and some foods.  Daily application of broad spectrum spf 30+ sunscreen to face is recommended to reduce flares.  Patient deferred treatment at this time since not bothersome to her.   Hemangiomas - Red papules - Discussed benign nature - Observe - Call for any changes  Actinic Damage - chronic, secondary to cumulative UV radiation exposure/sun exposure over time - diffuse scaly erythematous macules with underlying dyspigmentation - Recommend daily broad spectrum sunscreen SPF 30+ to sun-exposed areas, reapply every 2 hours as needed.  - Recommend staying in the shade or wearing long sleeves, sun glasses (UVA+UVB protection) and wide brim hats (4-inch brim around the  entire circumference of the hat). - Call for new or changing lesions.  History of Dysplastic Nevi At right palm thickened scar shv removal 07/28/22 Left posterior shoulder severe atypia excised 07/21/22 Left spinal mid upper back moderate atypia - No evidence of recurrence today - Recommend regular full body skin exams - Recommend daily broad spectrum sunscreen SPF 30+ to sun-exposed areas, reapply every 2 hours as needed.  - Call if any new or changing lesions are noted between office visits   Return for aug or september tbse . I, Ruthell Rummage, CMA, am acting as scribe for Brendolyn Patty, MD.  Documentation: I have reviewed the above documentation for accuracy and completeness, and I agree with the above.  Brendolyn Patty MD

## 2023-07-14 ENCOUNTER — Ambulatory Visit: Payer: Medicare Other | Admitting: Dermatology

## 2023-07-14 ENCOUNTER — Encounter: Payer: Self-pay | Admitting: Dermatology

## 2023-07-14 VITALS — BP 136/74 | HR 75

## 2023-07-14 DIAGNOSIS — W908XXA Exposure to other nonionizing radiation, initial encounter: Secondary | ICD-10-CM | POA: Diagnosis not present

## 2023-07-14 DIAGNOSIS — L578 Other skin changes due to chronic exposure to nonionizing radiation: Secondary | ICD-10-CM | POA: Diagnosis not present

## 2023-07-14 DIAGNOSIS — D2361 Other benign neoplasm of skin of right upper limb, including shoulder: Secondary | ICD-10-CM

## 2023-07-14 DIAGNOSIS — L821 Other seborrheic keratosis: Secondary | ICD-10-CM

## 2023-07-14 DIAGNOSIS — L820 Inflamed seborrheic keratosis: Secondary | ICD-10-CM | POA: Diagnosis not present

## 2023-07-14 DIAGNOSIS — D239 Other benign neoplasm of skin, unspecified: Secondary | ICD-10-CM

## 2023-07-14 DIAGNOSIS — D2372 Other benign neoplasm of skin of left lower limb, including hip: Secondary | ICD-10-CM

## 2023-07-14 DIAGNOSIS — D2271 Melanocytic nevi of right lower limb, including hip: Secondary | ICD-10-CM

## 2023-07-14 DIAGNOSIS — Z872 Personal history of diseases of the skin and subcutaneous tissue: Secondary | ICD-10-CM

## 2023-07-14 DIAGNOSIS — Z1283 Encounter for screening for malignant neoplasm of skin: Secondary | ICD-10-CM

## 2023-07-14 DIAGNOSIS — L719 Rosacea, unspecified: Secondary | ICD-10-CM | POA: Diagnosis not present

## 2023-07-14 DIAGNOSIS — D2239 Melanocytic nevi of other parts of face: Secondary | ICD-10-CM

## 2023-07-14 DIAGNOSIS — L814 Other melanin hyperpigmentation: Secondary | ICD-10-CM

## 2023-07-14 DIAGNOSIS — D1801 Hemangioma of skin and subcutaneous tissue: Secondary | ICD-10-CM

## 2023-07-14 DIAGNOSIS — Z86018 Personal history of other benign neoplasm: Secondary | ICD-10-CM

## 2023-07-14 DIAGNOSIS — D2362 Other benign neoplasm of skin of left upper limb, including shoulder: Secondary | ICD-10-CM

## 2023-07-14 DIAGNOSIS — D229 Melanocytic nevi, unspecified: Secondary | ICD-10-CM

## 2023-07-14 NOTE — Patient Instructions (Addendum)
Rosacea  What is rosacea? Rosacea (say: ro-zay-sha) is a common skin disease that usually begins as a trend of flushing or blushing easily.  As rosacea progresses, a persistent redness in the center of the face will develop and may gradually spread beyond the nose and cheeks to the forehead and chin.  In some cases, the ears, chest, and back could be affected.  Rosacea may appear as tiny blood vessels or small red bumps that occur in crops.  Frequently they can contain pus, and are called "pustules".  If the bumps do not contain pus, they are referred to as "papules".  Rarely, in prolonged, untreated cases of rosacea, the oil glands of the nose and cheeks may become permanently enlarged.  This is called rhinophyma, and is seen more frequently in men.  Signs and Risks In its beginning stages, rosacea tends to come and go, which makes it difficult to recognize.  It can start as intermittent flushing of the face.  Eventually, blood vessels may become permanently visible.  Pustules and papules can appear, but can be mistaken for adult acne.  People of all races, ages, genders and ethnic groups are at risk of developing rosacea.  However, it is more common in women (especially around menopause) and adults with fair skin between the ages of 54 and 74.  Treatment Dermatologists typically recommend a combination of treatments to effectively manage rosacea.  Treatment can improve symptoms and may stop the progression of the rosacea.  Treatment may involve both topical and oral medications.  The tetracycline antibiotics are often used for their anti-inflammatory effect; however, because of the possibility of developing antibiotic resistance, they should not be used long term at full dose.  For dilated blood vessels the options include electrodessication (uses electric current through a small needle), laser treatment, and cosmetics to hide the redness.   With all forms of treatment, improvement is a slow process,  and patients may not see any results for the first 3-4 weeks.  It is very important to avoid the sun and other triggers.  Patients must wear sunscreen daily.  Skin Care Instructions: Cleanse the skin with a mild soap such as CeraVe cleanser, Cetaphil cleanser, or Dove soap once or twice daily as needed. Moisturize with Eucerin Redness Relief Daily Perfecting Lotion (has a subtle green tint), CeraVe Moisturizing Cream, or Oil of Olay Daily Moisturizer with sunscreen every morning and/or night as recommended. Makeup should be "non-comedogenic" (won't clog pores) and be labeled "for sensitive skin". Good choices for cosmetics are: Neutrogena, Almay, and Physician's Formula.  Any product with a green tint tends to offset a red complexion. If your eyes are dry and irritated, use artificial tears 2-3 times per day and cleanse the eyelids daily with baby shampoo.  Have your eyes examined at least every 2 years.  Be sure to tell your eye doctor that you have rosacea. Alcoholic beverages tend to cause flushing of the skin, and may make rosacea worse. Always wear sunscreen, protect your skin from extreme hot and cold temperatures, and avoid spicy foods, hot drinks, and mechanical irritation such as rubbing, scrubbing, or massaging the face.  Avoid harsh skin cleansers, cleansing masks, astringents, and exfoliation. If a particular product burns or makes your face feel tight, then it is likely to flare your rosacea. If you are having difficulty finding a sunscreen that you can tolerate, you may try switching to a chemical-free sunscreen.  These are ones whose active ingredient is zinc oxide or titanium dioxide  only.  They should also be fragrance free, non-comedogenic, and labeled for sensitive skin. Rosacea triggers may vary from person to person.  There are a variety of foods that have been reported to trigger rosacea.  Some patients find that keeping a diary of what they were doing when they flared helps them  avoid triggers.      Seborrheic Keratosis  What causes seborrheic keratoses? Seborrheic keratoses are harmless, common skin growths that first appear during adult life.  As time goes by, more growths appear.  Some people may develop a large number of them.  Seborrheic keratoses appear on both covered and uncovered body parts.  They are not caused by sunlight.  The tendency to develop seborrheic keratoses can be inherited.  They vary in color from skin-colored to gray, brown, or even black.  They can be either smooth or have a rough, warty surface.   Seborrheic keratoses are superficial and look as if they were stuck on the skin.  Under the microscope this type of keratosis looks like layers upon layers of skin.  That is why at times the top layer may seem to fall off, but the rest of the growth remains and re-grows.    Treatment Seborrheic keratoses do not need to be treated, but can easily be removed in the office.  Seborrheic keratoses often cause symptoms when they rub on clothing or jewelry.  Lesions can be in the way of shaving.  If they become inflamed, they can cause itching, soreness, or burning.  Removal of a seborrheic keratosis can be accomplished by freezing, burning, or surgery. If any spot bleeds, scabs, or grows rapidly, please return to have it checked, as these can be an indication of a skin cancer.  Cryotherapy Aftercare  Wash gently with soap and water everyday.   Apply Vaseline and Band-Aid daily until healed.       Melanoma ABCDEs  Melanoma is the most dangerous type of skin cancer, and is the leading cause of death from skin disease.  You are more likely to develop melanoma if you: Have light-colored skin, light-colored eyes, or red or blond hair Spend a lot of time in the sun Tan regularly, either outdoors or in a tanning bed Have had blistering sunburns, especially during childhood Have a close family member who has had a melanoma Have atypical moles or large  birthmarks  Early detection of melanoma is key since treatment is typically straightforward and cure rates are extremely high if we catch it early.   The first sign of melanoma is often a change in a mole or a new dark spot.  The ABCDE system is a way of remembering the signs of melanoma.  A for asymmetry:  The two halves do not match. B for border:  The edges of the growth are irregular. C for color:  A mixture of colors are present instead of an even brown color. D for diameter:  Melanomas are usually (but not always) greater than 6mm - the size of a pencil eraser. E for evolution:  The spot keeps changing in size, shape, and color.  Please check your skin once per month between visits. You can use a small mirror in front and a large mirror behind you to keep an eye on the back side or your body.   If you see any new or changing lesions before your next follow-up, please call to schedule a visit.  Please continue daily skin protection including broad spectrum sunscreen SPF  30+ to sun-exposed areas, reapplying every 2 hours as needed when you're outdoors.   Staying in the shade or wearing long sleeves, sun glasses (UVA+UVB protection) and wide brim hats (4-inch brim around the entire circumference of the hat) are also recommended for sun protection.    Due to recent changes in healthcare laws, you may see results of your pathology and/or laboratory studies on MyChart before the doctors have had a chance to review them. We understand that in some cases there may be results that are confusing or concerning to you. Please understand that not all results are received at the same time and often the doctors may need to interpret multiple results in order to provide you with the best plan of care or course of treatment. Therefore, we ask that you please give [place] 2 business days to thoroughly review all your results before contacting the office for clarification. Should we see a critical lab result, you  will be contacted sooner.   If You Need Anything After Your Visit  If you have any questions or concerns for your doctor, please call our main line at [phone] and press option 4 to reach your doctor's medical assistant. If no one answers, please leave a voicemail as directed and we will return your call as soon as possible. Messages left after 4 pm will be answered the following business day.   You may also send [place] a message via MyChart. We typically respond to MyChart messages within 1-2 business days.  For prescription refills, please ask your pharmacy to contact our office. Our fax number is [phone].  If you have an urgent issue when the clinic is closed that cannot wait until the next business day, you can page your doctor at the number below.    Please note that while we do our best to be available for urgent issues outside of office hours, we are not available 24/7.   If you have an urgent issue and are unable to reach [place], you may choose to seek medical care at your doctor's office, retail clinic, urgent care center, or emergency room.  If you have a medical emergency, please immediately call 911 or go to the emergency department.  Pager Numbers  - Dr. Gwen Pounds: [phone]  - Dr. Roseanne Reno: [phone]  - Dr. Katrinka Blazing: [phone]   In the event of inclement weather, please call our main line at [phone] for an update on the status of any delays or closures.  Dermatology Medication Tips: Please keep the boxes that topical medications come in in order to help keep track of the instructions about where and how to use these. Pharmacies typically print the medication instructions only on the boxes and not directly on the medication tubes.   If your medication is too expensive, please contact our office at [phone] option 4 or send [place] a message through MyChart.   We are unable to tell what your co-pay for medications will be in advance as this is different  depending on your insurance coverage. However, we may be able to find a substitute medication at lower cost or fill out paperwork to get insurance to cover a needed medication.   If a prior authorization is required to get your medication covered by your insurance company, please allow [place] 1-2 business days to complete this process.  Drug prices often vary depending on where the prescription is filled and some pharmacies may offer cheaper prices.  The website www.goodrx.com contains coupons for medications through different  pharmacies. The prices here do not account for what the cost may be with help from insurance (it may be cheaper with your insurance), but the website can give you the price if you did not use any insurance.  - You can print the associated coupon and take it with your prescription to the pharmacy.  - You may also stop by our office during regular business hours and pick up a GoodRx coupon card.  - If you need your prescription sent electronically to a different pharmacy, notify our office through [facility] or by phone at [phone] option 4.     Si Usted Necesita Algo Despus de Su Visita  Tambin puede enviarnos un mensaje a travs de Clinical cytogeneticist. Por lo general respondemos a los mensajes de MyChart en el transcurso de 1 a 2 das hbiles.  Para renovar recetas, por favor pida a su farmacia que se ponga en contacto con nuestra oficina. Annie Sable de fax es [place] [phone].  Si tiene un asunto urgente cuando la clnica est cerrada y que no puede esperar hasta el siguiente da hbil, puede llamar/localizar a su doctor(a) al nmero que aparece a continuacin.   Por favor, tenga en cuenta que aunque hacemos todo lo posible para estar disponibles para asuntos urgentes fuera del horario de [place], no estamos disponibles las 24 horas del da, los 7 [address]  [address] de la [place].   Si tiene un problema urgente y no puede comunicarse con nosotros, puede optar por buscar atencin  mdica  en el consultorio de su doctor(a), en una clnica privada, en un centro de atencin urgente o en una sala de emergencias.  Si tiene Engineer, drilling, por favor llame inmediatamente al 911 o vaya a la sala de emergencias.  Nmeros de bper  - Dr. Gwen Pounds: [phone]  - Dra. Roseanne Reno: [phone]  - Dr. Katrinka Blazing: [phone]   En caso de inclemencias del tiempo, por favor llame a Lacy Duverney principal al [phone] para una actualizacin sobre el [place] de cualquier retraso o cierre.  Consejos para la medicacin en dermatologa: Por favor, guarde las cajas en las que vienen los medicamentos de uso tpico para ayudarle a seguir las instrucciones sobre dnde y cmo usarlos. Las farmacias generalmente imprimen las instrucciones del medicamento slo en las cajas y no directamente en los tubos del [place].   Si su medicamento es muy caro, por favor, pngase en contacto con Rolm Gala llamando al [phone] y presione la opcin 4 o envenos un mensaje a travs de Clinical cytogeneticist.   No podemos decirle cul ser su copago por los medicamentos por adelantado ya que esto es diferente dependiendo de la cobertura de su seguro. Sin embargo, es posible que podamos encontrar un medicamento sustituto a Audiological scientist un formulario para que el seguro cubra el medicamento que se considera necesario.   Si se requiere una autorizacin previa para que su compaa de seguros [place] su medicamento, por favor permtanos de 1 a 2 das hbiles para completar [address]  Los precios de los medicamentos varan con frecuencia dependiendo del Environmental consultant de dnde se surte la receta y alguna farmacias pueden ofrecer precios ms baratos.  El sitio web www.goodrx.com tiene cupones para medicamentos de Health and safety inspector. Los precios aqu no tienen en cuenta lo que podra costar con la ayuda del seguro (puede ser ms barato con su seguro), pero el sitio web puede darle el precio si no utiliz Producer, television/film/video.  - Puede imprimir el cupn correspondiente y llevarlo con  su receta a la farmacia.  - Tambin puede pasar por nuestra oficina durante el horario de atencin regular y Education officer, museum una tarjeta de cupones de GoodRx.  - Si necesita que su receta se enve electrnicamente a una farmacia diferente, informe a nuestra oficina a travs de MyChart de [place] o por telfono llamando al [phone] y presione la opcin 4.

## 2023-07-14 NOTE — Progress Notes (Signed)
Follow-Up Visit   Subjective  Roberta Gill is a 67 y.o. female who presents for the following: Skin Cancer Screening and Full Body Skin Exam hx of dysplastic nevi, hx of isks, hx of dermatofibroma, hx of rosacea but patient currently not on treatment. Reports a spot at left lower back she would like checked   The patient presents for Total-Body Skin Exam (TBSE) for skin cancer screening and mole check. The patient has spots, moles and lesions to be evaluated, some may be new or changing and the patient may have concern these could be cancer.    The following portions of the chart were reviewed this encounter and updated as appropriate: medications, allergies, medical history  Review of Systems:  No other skin or systemic complaints except as noted in HPI or Assessment and Plan.  Objective  Well appearing patient in no apparent distress; mood and affect are within normal limits.  A full examination was performed including scalp, head, eyes, ears, nose, lips, neck, chest, axillae, abdomen, back, buttocks, bilateral upper extremities, bilateral lower extremities, hands, feet, fingers, toes, fingernails, and toenails. All findings within normal limits unless otherwise noted below.   Relevant physical exam findings are noted in the Assessment and Plan.  Left Lower Back, L calf (2) Erythematous stuck-on, waxy patch,  without features suspicious for malignancy on dermoscopy          Assessment & Plan   SKIN CANCER SCREENING PERFORMED TODAY.  ACTINIC DAMAGE - Chronic condition, secondary to cumulative UV/sun exposure - diffuse scaly erythematous macules with underlying dyspigmentation - Recommend daily broad spectrum sunscreen SPF 30+ to sun-exposed areas, reapply every 2 hours as needed.  - Staying in the shade or wearing long sleeves, sun glasses (UVA+UVB protection) and wide brim hats (4-inch brim around the entire circumference of the hat) are also recommended for sun  protection.  - Call for new or changing lesions.  LENTIGINES, SEBORRHEIC KERATOSES, HEMANGIOMAS - Benign normal skin lesions - Benign-appearing - Call for any changes  MELANOCYTIC NEVI - Tan-brown and/or pink-flesh-colored symmetric macules and papules, including right eyebrow - Benign appearing on exam today - Observation - Call clinic for new or changing moles - Recommend daily use of broad spectrum spf 30+ sunscreen to sun-exposed areas.    Blue Nevus 3 mm blue macule at left lateral forehead  3 mm dark blue gray macule on right lateral lower knee  Benign-appearing. Stable compared to previous visit. Observation.  Call clinic for new or changing moles.  Recommend daily use of broad spectrum spf 30+ sunscreen to sun-exposed areas.    ROSACEA Exam erythema with telangiectasias at cheeks and nose  Chronic and persistent condition with duration or expected duration over one year. Condition is symptomatic/ bothersome to patient. Not currently at goal.   Rosacea is a chronic progressive skin condition usually affecting the face of adults, causing redness and/or acne bumps. It is treatable but not curable. It sometimes affects the eyes (ocular rosacea) as well. It may respond to topical and/or systemic medication and can flare with stress, sun exposure, alcohol, exercise, topical steroids (including hydrocortisone/cortisone 10) and some foods.  Daily application of broad spectrum spf 30+ sunscreen to face is recommended to reduce flares.  Patient denies grittiness of the eyes  Treatment Plan Patient is not bothered by and defers treatment at this time.    DERMATOFIBROMA At left posterior upper arm, right forearm Left upper thigh , left forearm  Exam: Firm pink/brown papulenodule with dimple sign.  Treatment Plan: A dermatofibroma is a benign growth possibly related to trauma, such as an insect bite, cut from shaving, or inflamed acne-type bump.  Treatment options to remove  include shave or excision with resulting scar and risk of recurrence.  Since benign-appearing and not bothersome, will observe for now.   HISTORY OF DYSPLASTIC NEVUS See history  At right palm shv removal 07/28/22 Left posterior shoulder severe atypia excised 07/21/22 Left spinal mid upper back moderate atypia  No evidence of recurrence today Recommend regular full body skin exams Recommend daily broad spectrum sunscreen SPF 30+ to sun-exposed areas, reapply every 2 hours as needed.  Call if any new or changing lesions are noted between office visits    Inflamed seborrheic keratosis (2) Left Lower Back, L calf  Symptomatic, irritating, patient would like treated.  Photo today at left lower calf will treat with ln2 today and recheck at next follow up  Destruction of lesion - Left Lower Back, L calf (2)  Destruction method: cryotherapy   Informed consent: discussed and consent obtained   Lesion destroyed using liquid nitrogen: Yes   Region frozen until ice ball extended beyond lesion: Yes   Outcome: patient tolerated procedure well with no complications   Post-procedure details: wound care instructions given   Additional details:  Prior to procedure, discussed risks of blister formation, small wound, skin dyspigmentation, or rare scar following cryotherapy. Recommend Vaseline ointment to treated areas while healing.    Return in about 3 months (around 10/13/2023) for recheck isk at left lower calf and left lower back, 1 year tbse .  I, Asher Muir, CMA, am acting as scribe for Willeen Niece, MD.   Documentation: I have reviewed the above documentation for accuracy and completeness, and I agree with the above.  Willeen Niece, MD

## 2023-09-01 ENCOUNTER — Other Ambulatory Visit: Payer: Self-pay | Admitting: Internal Medicine

## 2023-09-01 DIAGNOSIS — Z1231 Encounter for screening mammogram for malignant neoplasm of breast: Secondary | ICD-10-CM

## 2023-09-17 ENCOUNTER — Ambulatory Visit
Admission: RE | Admit: 2023-09-17 | Discharge: 2023-09-17 | Disposition: A | Discharge status: Home / Self Care | Payer: Medicare Other | Source: Ambulatory Visit | Attending: Internal Medicine | Admitting: Internal Medicine

## 2023-09-17 DIAGNOSIS — Z1231 Encounter for screening mammogram for malignant neoplasm of breast: Secondary | ICD-10-CM | POA: Diagnosis present

## 2023-10-13 ENCOUNTER — Ambulatory Visit: Payer: Medicare Other | Admitting: Dermatology

## 2023-10-13 DIAGNOSIS — L820 Inflamed seborrheic keratosis: Secondary | ICD-10-CM | POA: Diagnosis not present

## 2023-10-13 DIAGNOSIS — L750 Bromhidrosis: Secondary | ICD-10-CM | POA: Diagnosis not present

## 2023-10-13 MED ORDER — CLINDAMYCIN PHOSPHATE 1 % EX LOTN: TOPICAL_LOTION | CUTANEOUS | 5 refills | Status: AC

## 2023-10-13 NOTE — Progress Notes (Signed)
   Follow-Up Visit   Subjective  Roberta Gill is a 67 y.o. female who presents for the following: Inflamed SK of the left lower calf, treated with cryotherapy 3 months ago, improved. She has a couple of spots on her back that gets itchy at times. Patient also complains of underarm odor, no excessive wetness. She has tried multiple deodorants with no improvement.   The patient has spots, moles and lesions to be evaluated, some may be new or changing.   The following portions of the chart were reviewed this encounter and updated as appropriate: medications, allergies, medical history  Review of Systems:  No other skin or systemic complaints except as noted in HPI or Assessment and Plan.  Objective  Well appearing patient in no apparent distress; mood and affect are within normal limits.  A focused examination was performed of the following areas: Left leg, back  Relevant physical exam findings are noted in the Assessment and Plan.  Right Mid Lower Back x 1; Right Mid Back at Bra line x 1; Right Medial Shoulder x 1 (3) Erythematous stuck-on, waxy papule  Left lower calf is clear (previously treated last visit)    Assessment & Plan   Inflamed seborrheic keratosis (3) Right Mid Lower Back x 1; Right Mid Back at Bra line x 1; Right Medial Shoulder x 1  Symptomatic, irritating, patient would like treated.  ISK L calf treated last visit is clear  Destruction of lesion - Right Mid Lower Back x 1; Right Mid Back at Bra line x 1; Right Medial Shoulder x 1 (3)  Destruction method: cryotherapy   Informed consent: discussed and consent obtained   Lesion destroyed using liquid nitrogen: Yes   Region frozen until ice ball extended beyond lesion: Yes   Outcome: patient tolerated procedure well with no complications   Post-procedure details: wound care instructions given   Additional details:  Prior to procedure, discussed risks of blister formation, small wound, skin dyspigmentation,  or rare scar following cryotherapy. Recommend Vaseline ointment to treated areas while healing.   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  Chronic and persistent condition with duration or expected duration over one year. Condition is bothersome/symptomatic for patient. Currently flared.   Treatment:  Start clindamycin lotion apply to underarms once to twice daily 5Rf. Continue antiperspirant daily   Return as scheduled.  ICherlyn Labella, CMA, am acting as scribe for Willeen Niece, MD .   Documentation: I have reviewed the above documentation for accuracy and completeness, and I agree with the above.  Willeen Niece, MD

## 2023-10-13 NOTE — Patient Instructions (Addendum)

## 2024-07-19 ENCOUNTER — Ambulatory Visit: Payer: Medicare Other | Admitting: Dermatology

## 2024-07-19 DIAGNOSIS — L719 Rosacea, unspecified: Secondary | ICD-10-CM

## 2024-07-19 DIAGNOSIS — Z1283 Encounter for screening for malignant neoplasm of skin: Secondary | ICD-10-CM | POA: Diagnosis not present

## 2024-07-19 DIAGNOSIS — D2330 Other benign neoplasm of skin of unspecified part of face: Secondary | ICD-10-CM

## 2024-07-19 DIAGNOSIS — W908XXA Exposure to other nonionizing radiation, initial encounter: Secondary | ICD-10-CM

## 2024-07-19 DIAGNOSIS — D229 Melanocytic nevi, unspecified: Secondary | ICD-10-CM

## 2024-07-19 DIAGNOSIS — L821 Other seborrheic keratosis: Secondary | ICD-10-CM

## 2024-07-19 DIAGNOSIS — D239 Other benign neoplasm of skin, unspecified: Secondary | ICD-10-CM

## 2024-07-19 DIAGNOSIS — D2271 Melanocytic nevi of right lower limb, including hip: Secondary | ICD-10-CM

## 2024-07-19 DIAGNOSIS — L578 Other skin changes due to chronic exposure to nonionizing radiation: Secondary | ICD-10-CM

## 2024-07-19 DIAGNOSIS — D2372 Other benign neoplasm of skin of left lower limb, including hip: Secondary | ICD-10-CM

## 2024-07-19 DIAGNOSIS — D2361 Other benign neoplasm of skin of right upper limb, including shoulder: Secondary | ICD-10-CM

## 2024-07-19 DIAGNOSIS — D2362 Other benign neoplasm of skin of left upper limb, including shoulder: Secondary | ICD-10-CM

## 2024-07-19 DIAGNOSIS — L814 Other melanin hyperpigmentation: Secondary | ICD-10-CM

## 2024-07-19 DIAGNOSIS — D2371 Other benign neoplasm of skin of right lower limb, including hip: Secondary | ICD-10-CM | POA: Diagnosis not present

## 2024-07-19 DIAGNOSIS — D1801 Hemangioma of skin and subcutaneous tissue: Secondary | ICD-10-CM

## 2024-07-19 DIAGNOSIS — L729 Follicular cyst of the skin and subcutaneous tissue, unspecified: Secondary | ICD-10-CM

## 2024-07-19 DIAGNOSIS — Z86018 Personal history of other benign neoplasm: Secondary | ICD-10-CM

## 2024-07-19 NOTE — Patient Instructions (Signed)

## 2024-07-19 NOTE — Progress Notes (Signed)
 Follow-Up Visit   Subjective  Roberta Gill is a 68 y.o. female who presents for the following: Skin Cancer Screening and Full Body Skin Exam. Hx Dysplastic nevus. Patient reports three places at back that get gunk on them, not painful but do itch at times. Possible new mole at right neck. Not bothersome.  The patient presents for Total-Body Skin Exam (TBSE) for skin cancer screening and mole check. The patient has spots, moles and lesions to be evaluated, some may be new or changing and the patient may have concern these could be cancer.   The following portions of the chart were reviewed this encounter and updated as appropriate: medications, allergies, medical history  Review of Systems:  No other skin or systemic complaints except as noted in HPI or Assessment and Plan.  Objective  Well appearing patient in no apparent distress; mood and affect are within normal limits.  A full examination was performed including scalp, head, eyes, ears, nose, lips, neck, chest, axillae, abdomen, back, buttocks, bilateral upper extremities, bilateral lower extremities, hands, feet, fingers, toes, fingernails, and toenails. All findings within normal limits unless otherwise noted below.   Relevant physical exam findings are noted in the Assessment and Plan.  Upper back x3 Dilated pore/large open comedone x 3 upper back  Assessment & Plan   SKIN CANCER SCREENING PERFORMED TODAY.  ACTINIC DAMAGE - Chronic condition, secondary to cumulative UV/sun exposure - diffuse scaly erythematous macules with underlying dyspigmentation - Recommend daily broad spectrum sunscreen SPF 30+ to sun-exposed areas, reapply every 2 hours as needed.  - Staying in the shade or wearing long sleeves, sun glasses (UVA+UVB protection) and wide brim hats (4-inch brim around the entire circumference of the hat) are also recommended for sun protection.  - Call for new or changing lesions.  LENTIGINES, SEBORRHEIC  KERATOSES, HEMANGIOMAS - Benign normal skin lesions - Benign-appearing - Call for any changes  LENTIGO Exam: 2 mm med brown macule at right neck (vs nevus) Due to sun exposure  Treatment Plan: Benign-appearing, observe. Recommend daily broad spectrum sunscreen SPF 30+ to sun-exposed areas, reapply every 2 hours as needed.  Call for any changes  MELANOCYTIC NEVI - Tan-brown and/or pink-flesh-colored symmetric macules and papules - 4 mm flesh, tan papule darker edge at left anterior thigh. Pt states present since childhood, no changes - Benign appearing on exam today - Observation - Call clinic for new or changing moles - Recommend daily use of broad spectrum spf 30+ sunscreen to sun-exposed areas.   Blue Nevus Exam: 3 mm blue macule at left lateral forehead , 3 mm dark blue gray macule on right lateral lower knee   Benign-appearing. Stable compared to previous visit. Observation.  Call clinic for new or changing moles.  Recommend daily use of broad spectrum spf 30+ sunscreen to sun-exposed areas.   ROSACEA Exam: Mid face erythema with telangiectasias at mid face.   Chronic and persistent condition with duration or expected duration over one year. Condition is not symptomatic/ bothersome to patient.  Rosacea is a chronic progressive skin condition usually affecting the face of adults, causing redness and/or acne bumps. It is treatable but not curable. It sometimes affects the eyes (ocular rosacea) as well. It may respond to topical and/or systemic medication and can flare with stress, sun exposure, alcohol, exercise, topical steroids (including hydrocortisone/cortisone 10) and some foods.  Daily application of broad spectrum spf 30+ sunscreen to face is recommended to reduce flares.  Patient denies grittiness of the eyes  Treatment Plan Patient is not bothered by and defers treatment at this time.  Recommend daily moisturizer with sunscreen.   DERMATOFIBROMA Left posterior upper  arm, right forearm Left upper thigh, left forearm  Exam: Firm pink/brown papulenodule with dimple sign.  Treatment Plan: A dermatofibroma is a benign growth possibly related to trauma, such as an insect bite, cut from shaving, or inflamed acne-type bump.  Treatment options to remove include shave or excision with resulting scar and risk of recurrence.  Since benign-appearing and not bothersome, will observe for now.   HISTORY OF DYSPLASTIC NEVUS Multiple see history No evidence of recurrence today Recommend regular full body skin exams Recommend daily broad spectrum sunscreen SPF 30+ to sun-exposed areas, reapply every 2 hours as needed.  Call if any new or changing lesions are noted between office visits   DILATED PORE OF WINER Upper back x3 Benign, observe.    Discussed type of small cyst,  if bothersome, may be excised. Patient defers at this time.   Return in about 1 year (around 07/19/2025) for TBSE, HxDysplastic Nevi, w/ Dr. Jackquline.  I, Jacquelynn V. Wilfred, CMA, am acting as scribe for Rexene Jackquline, MD .   Documentation: I have reviewed the above documentation for accuracy and completeness, and I agree with the above.  Rexene Jackquline, MD

## 2025-07-25 ENCOUNTER — Encounter: Admitting: Dermatology
